# Patient Record
Sex: Female | Born: 1950
Health system: Southern US, Community
[De-identification: ages and names within clinical notes are randomized; demographics above are authoritative.]

## PROBLEM LIST (undated history)

## (undated) DIAGNOSIS — T4145XA Adverse effect of unspecified anesthetic, initial encounter: Secondary | ICD-10-CM

## (undated) DIAGNOSIS — K869 Disease of pancreas, unspecified: Secondary | ICD-10-CM

## (undated) DIAGNOSIS — H269 Unspecified cataract: Secondary | ICD-10-CM

## (undated) DIAGNOSIS — T8859XA Other complications of anesthesia, initial encounter: Secondary | ICD-10-CM

## (undated) DIAGNOSIS — E119 Type 2 diabetes mellitus without complications: Secondary | ICD-10-CM

## (undated) DIAGNOSIS — Z923 Personal history of irradiation: Secondary | ICD-10-CM

## (undated) DIAGNOSIS — C541 Malignant neoplasm of endometrium: Secondary | ICD-10-CM

## (undated) HISTORY — DX: Unspecified cataract: H26.9

## (undated) HISTORY — PX: COLONOSCOPY: SHX174

## (undated) HISTORY — PX: CARDIAC SURGERY: SHX584

## (undated) HISTORY — PX: APPENDECTOMY: SHX54

## (undated) HISTORY — DX: Personal history of irradiation: Z92.3

## (undated) HISTORY — PX: OTHER SURGICAL HISTORY: SHX169

## (undated) HISTORY — PX: BACK SURGERY: SHX140

## (undated) HISTORY — DX: Type 2 diabetes mellitus without complications: E11.9

---
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
## 2006-02-09 ENCOUNTER — Ambulatory Visit: Payer: Self-pay | Admitting: Gastroenterology

## 2006-02-16 ENCOUNTER — Ambulatory Visit: Payer: Self-pay | Admitting: Gastroenterology

## 2011-05-13 HISTORY — PX: SVT ABLATION: EP1225

## 2013-09-26 DIAGNOSIS — E785 Hyperlipidemia, unspecified: Secondary | ICD-10-CM | POA: Diagnosis not present

## 2013-09-26 DIAGNOSIS — M949 Disorder of cartilage, unspecified: Secondary | ICD-10-CM | POA: Diagnosis not present

## 2013-09-26 DIAGNOSIS — E119 Type 2 diabetes mellitus without complications: Secondary | ICD-10-CM | POA: Diagnosis not present

## 2013-09-26 DIAGNOSIS — M899 Disorder of bone, unspecified: Secondary | ICD-10-CM | POA: Diagnosis not present

## 2013-09-30 DIAGNOSIS — E785 Hyperlipidemia, unspecified: Secondary | ICD-10-CM | POA: Diagnosis not present

## 2013-09-30 DIAGNOSIS — Z0000 Encounter for general adult medical examination without abnormal findings: Secondary | ICD-10-CM | POA: Diagnosis not present

## 2013-09-30 DIAGNOSIS — K5909 Other constipation: Secondary | ICD-10-CM | POA: Diagnosis not present

## 2013-09-30 DIAGNOSIS — G4700 Insomnia, unspecified: Secondary | ICD-10-CM | POA: Diagnosis not present

## 2013-09-30 DIAGNOSIS — E119 Type 2 diabetes mellitus without complications: Secondary | ICD-10-CM | POA: Diagnosis not present

## 2013-09-30 DIAGNOSIS — I498 Other specified cardiac arrhythmias: Secondary | ICD-10-CM | POA: Diagnosis not present

## 2013-09-30 DIAGNOSIS — M899 Disorder of bone, unspecified: Secondary | ICD-10-CM | POA: Diagnosis not present

## 2013-09-30 DIAGNOSIS — J309 Allergic rhinitis, unspecified: Secondary | ICD-10-CM | POA: Diagnosis not present

## 2013-09-30 DIAGNOSIS — Z23 Encounter for immunization: Secondary | ICD-10-CM | POA: Diagnosis not present

## 2013-09-30 DIAGNOSIS — Z1331 Encounter for screening for depression: Secondary | ICD-10-CM | POA: Diagnosis not present

## 2013-09-30 DIAGNOSIS — M949 Disorder of cartilage, unspecified: Secondary | ICD-10-CM | POA: Diagnosis not present

## 2013-09-30 DIAGNOSIS — Z124 Encounter for screening for malignant neoplasm of cervix: Secondary | ICD-10-CM | POA: Diagnosis not present

## 2013-10-17 DIAGNOSIS — N6459 Other signs and symptoms in breast: Secondary | ICD-10-CM | POA: Diagnosis not present

## 2013-10-17 DIAGNOSIS — N63 Unspecified lump in unspecified breast: Secondary | ICD-10-CM | POA: Diagnosis not present

## 2013-10-17 DIAGNOSIS — Z803 Family history of malignant neoplasm of breast: Secondary | ICD-10-CM | POA: Diagnosis not present

## 2013-11-02 DIAGNOSIS — M899 Disorder of bone, unspecified: Secondary | ICD-10-CM | POA: Diagnosis not present

## 2013-11-02 DIAGNOSIS — M949 Disorder of cartilage, unspecified: Secondary | ICD-10-CM | POA: Diagnosis not present

## 2014-02-07 DIAGNOSIS — H2510 Age-related nuclear cataract, unspecified eye: Secondary | ICD-10-CM | POA: Diagnosis not present

## 2014-02-07 DIAGNOSIS — E119 Type 2 diabetes mellitus without complications: Secondary | ICD-10-CM | POA: Diagnosis not present

## 2014-02-07 DIAGNOSIS — H5210 Myopia, unspecified eye: Secondary | ICD-10-CM | POA: Diagnosis not present

## 2014-02-07 DIAGNOSIS — H40019 Open angle with borderline findings, low risk, unspecified eye: Secondary | ICD-10-CM | POA: Diagnosis not present

## 2014-02-10 DIAGNOSIS — Z23 Encounter for immunization: Secondary | ICD-10-CM | POA: Diagnosis not present

## 2014-04-19 DIAGNOSIS — M8580 Other specified disorders of bone density and structure, unspecified site: Secondary | ICD-10-CM | POA: Diagnosis not present

## 2014-04-19 DIAGNOSIS — E119 Type 2 diabetes mellitus without complications: Secondary | ICD-10-CM | POA: Diagnosis not present

## 2014-04-19 DIAGNOSIS — I498 Other specified cardiac arrhythmias: Secondary | ICD-10-CM | POA: Diagnosis not present

## 2014-04-19 DIAGNOSIS — E785 Hyperlipidemia, unspecified: Secondary | ICD-10-CM | POA: Diagnosis not present

## 2014-04-19 DIAGNOSIS — Z681 Body mass index (BMI) 19 or less, adult: Secondary | ICD-10-CM | POA: Diagnosis not present

## 2014-04-19 DIAGNOSIS — Z1389 Encounter for screening for other disorder: Secondary | ICD-10-CM | POA: Diagnosis not present

## 2014-04-19 DIAGNOSIS — G4700 Insomnia, unspecified: Secondary | ICD-10-CM | POA: Diagnosis not present

## 2014-05-30 DIAGNOSIS — H40013 Open angle with borderline findings, low risk, bilateral: Secondary | ICD-10-CM | POA: Diagnosis not present

## 2014-10-03 DIAGNOSIS — Z0000 Encounter for general adult medical examination without abnormal findings: Secondary | ICD-10-CM | POA: Diagnosis not present

## 2014-10-03 DIAGNOSIS — M859 Disorder of bone density and structure, unspecified: Secondary | ICD-10-CM | POA: Diagnosis not present

## 2014-10-03 DIAGNOSIS — E119 Type 2 diabetes mellitus without complications: Secondary | ICD-10-CM | POA: Diagnosis not present

## 2014-10-03 DIAGNOSIS — E785 Hyperlipidemia, unspecified: Secondary | ICD-10-CM | POA: Diagnosis not present

## 2014-10-05 DIAGNOSIS — E119 Type 2 diabetes mellitus without complications: Secondary | ICD-10-CM | POA: Diagnosis not present

## 2014-10-05 DIAGNOSIS — K5909 Other constipation: Secondary | ICD-10-CM | POA: Diagnosis not present

## 2014-10-05 DIAGNOSIS — Z1389 Encounter for screening for other disorder: Secondary | ICD-10-CM | POA: Diagnosis not present

## 2014-10-05 DIAGNOSIS — Z0000 Encounter for general adult medical examination without abnormal findings: Secondary | ICD-10-CM | POA: Diagnosis not present

## 2014-10-05 DIAGNOSIS — M8580 Other specified disorders of bone density and structure, unspecified site: Secondary | ICD-10-CM | POA: Diagnosis not present

## 2014-10-05 DIAGNOSIS — Z23 Encounter for immunization: Secondary | ICD-10-CM | POA: Diagnosis not present

## 2014-10-05 DIAGNOSIS — E785 Hyperlipidemia, unspecified: Secondary | ICD-10-CM | POA: Diagnosis not present

## 2014-10-05 DIAGNOSIS — Z6820 Body mass index (BMI) 20.0-20.9, adult: Secondary | ICD-10-CM | POA: Diagnosis not present

## 2014-10-05 DIAGNOSIS — G4700 Insomnia, unspecified: Secondary | ICD-10-CM | POA: Diagnosis not present

## 2014-10-05 DIAGNOSIS — I498 Other specified cardiac arrhythmias: Secondary | ICD-10-CM | POA: Diagnosis not present

## 2014-10-24 DIAGNOSIS — Z803 Family history of malignant neoplasm of breast: Secondary | ICD-10-CM | POA: Diagnosis not present

## 2014-10-24 DIAGNOSIS — Z1231 Encounter for screening mammogram for malignant neoplasm of breast: Secondary | ICD-10-CM | POA: Diagnosis not present

## 2014-12-12 ENCOUNTER — Encounter: Payer: Self-pay | Admitting: Gastroenterology

## 2015-02-16 DIAGNOSIS — E119 Type 2 diabetes mellitus without complications: Secondary | ICD-10-CM | POA: Diagnosis not present

## 2015-02-16 DIAGNOSIS — H40013 Open angle with borderline findings, low risk, bilateral: Secondary | ICD-10-CM | POA: Diagnosis not present

## 2015-02-16 DIAGNOSIS — H25011 Cortical age-related cataract, right eye: Secondary | ICD-10-CM | POA: Diagnosis not present

## 2015-02-16 DIAGNOSIS — H5211 Myopia, right eye: Secondary | ICD-10-CM | POA: Diagnosis not present

## 2015-03-06 DIAGNOSIS — Z23 Encounter for immunization: Secondary | ICD-10-CM | POA: Diagnosis not present

## 2015-04-11 DIAGNOSIS — E785 Hyperlipidemia, unspecified: Secondary | ICD-10-CM | POA: Diagnosis not present

## 2015-04-11 DIAGNOSIS — K5909 Other constipation: Secondary | ICD-10-CM | POA: Diagnosis not present

## 2015-04-11 DIAGNOSIS — E119 Type 2 diabetes mellitus without complications: Secondary | ICD-10-CM | POA: Diagnosis not present

## 2015-04-11 DIAGNOSIS — Z681 Body mass index (BMI) 19 or less, adult: Secondary | ICD-10-CM | POA: Diagnosis not present

## 2015-04-11 DIAGNOSIS — M859 Disorder of bone density and structure, unspecified: Secondary | ICD-10-CM | POA: Diagnosis not present

## 2015-04-18 ENCOUNTER — Ambulatory Visit (INDEPENDENT_AMBULATORY_CARE_PROVIDER_SITE_OTHER): Payer: Medicare Other | Admitting: Emergency Medicine

## 2015-04-18 VITALS — BP 120/68 | HR 99 | Temp 98.4°F | Resp 16 | Ht 62.0 in | Wt 112.0 lb

## 2015-04-18 DIAGNOSIS — M79641 Pain in right hand: Secondary | ICD-10-CM | POA: Diagnosis not present

## 2015-04-18 DIAGNOSIS — S61411A Laceration without foreign body of right hand, initial encounter: Secondary | ICD-10-CM | POA: Diagnosis not present

## 2015-04-18 MED ORDER — CEPHALEXIN 500 MG PO CAPS: 500.0000 mg | ORAL_CAPSULE | Freq: Four times a day (QID) | ORAL | Status: DC

## 2015-04-18 NOTE — Patient Instructions (Signed)

## 2015-04-18 NOTE — Progress Notes (Signed)
Subjective:  Patient ID: Kimberly [name], female    DOB: May 26, 1950  Age: 64 y.o. MRN: [id number]  CC: Hand Injury   HPI Kimberly [name] presents   With a laceration of the right hand  On the palm. She was injured last night on a mandolin which is a form of food slicer. She is current on tetanus. Denies any other injury. She is very concerned about the wound. She's also very germ phobic.  History Kimberly [name] has a past medical history of Diabetes mellitus without complication ([place]).   She has past surgical history that includes Appendectomy; Back surgery; Cardiac surgery; and OVARY REMOVE.   Her  family history includes Cancer in her sister; Diabetes in her mother; Heart disease in her father.  She   reports that she has never smoked. She has never used smokeless tobacco. She reports that she drinks alcohol. She reports that she does not use illicit drugs.  No outpatient prescriptions prior to visit.   No facility-administered medications prior to visit.    Social History   Social History  . Marital Status: Divorced    Spouse Name: N/A  . Number of Children: N/A  . Years of Education: N/A   Social History Main Topics  . Smoking status: Never Smoker   . Smokeless tobacco: Never Used  . Alcohol Use: 0.0 oz/week    0 Standard drinks or equivalent per week  . Drug Use: No  . Sexual Activity: Not Asked   Other Topics Concern  . None   Social History Narrative  . None     Review of Systems  Constitutional: Negative for fever, chills and appetite change.  HENT: Negative for congestion, ear pain, postnasal drip, sinus pressure and sore throat.   Eyes: Negative for pain and redness.  Respiratory: Negative for cough, shortness of breath and wheezing.   Cardiovascular: Negative for leg swelling.  Gastrointestinal: Negative for nausea, vomiting, abdominal pain, diarrhea, constipation and blood in stool.  Endocrine: Negative for polyuria.  Genitourinary: Negative for dysuria,  urgency, frequency and flank pain.  Musculoskeletal: Negative for gait problem.  Skin: Positive for wound. Negative for rash.  Neurological: Negative for weakness and headaches.  Psychiatric/Behavioral: Negative for confusion and decreased concentration. The patient is not nervous/anxious.     Objective:  BP 120/68 mmHg  Pulse 99  Temp(Src) 98.4 F (36.9 C) (Oral)  Resp 16  Ht 5\' 2"  (1.575 m)  Wt 112 lb (50.803 kg)  BMI 20.48 kg/m2  SpO2 97%  Physical Exam  Constitutional: She is oriented to person, place, and time. She appears well-developed and well-nourished. No distress.  HENT:  Head: Normocephalic and atraumatic.  Right Ear: External ear normal.  Left Ear: External ear normal.  Nose: Nose normal.  Eyes: Conjunctivae and EOM are normal. Pupils are equal, round, and reactive to light. No scleral icterus.  Neck: Normal range of motion. Neck supple. No tracheal deviation present.  Cardiovascular: Normal rate, regular rhythm and normal heart sounds.   Pulmonary/Chest: Effort normal. No respiratory distress. She has no wheezes. She has no rales.  Abdominal: She exhibits no mass. There is no tenderness. There is no rebound and no guarding.  Musculoskeletal: She exhibits no edema.  Lymphadenopathy:    She has no cervical adenopathy.  Neurological: She is alert and oriented to person, place, and time. Coordination normal.  Skin: Skin is warm and dry. Laceration (Should the full-thickness skin avulsion on the palm of her hand at the base of thumb proximally  2 x 3 cm. Is no neurovascular tendon injury no foreign body) noted. No rash noted.  Psychiatric: She has a normal mood and affect. Her behavior is normal.      Assessment & Plan:   Kimberly [name] was seen today for hand injury.  Diagnoses and all orders for this visit:  Pain of right hand  Laceration of right hand, initial encounter  Other orders -     cephALEXin (KEFLEX) 500 MG capsule; Take 1 capsule (500 mg total) by  mouth 4 (four) times daily.   I am having Ms. Kimberly [name] start on cephALEXin. I am also having her maintain her metFORMIN, rosuvastatin, raloxifene, and zolpidem.  Meds ordered this encounter  Medications  . metFORMIN (GLUCOPHAGE) 1000 MG tablet    Sig: Take 1,000 mg by mouth 2 (two) times daily with a meal.  . rosuvastatin (CRESTOR) 10 MG tablet    Sig: Take 10 mg by mouth daily.  . raloxifene (EVISTA) 60 MG tablet    Sig: Take 60 mg by mouth daily.  Marland Kitchen zolpidem (AMBIEN CR) 12.5 MG CR tablet    Sig: Take 12.5 mg by mouth at bedtime as needed for sleep.  . cephALEXin (KEFLEX) 500 MG capsule    Sig: Take 1 capsule (500 mg total) by mouth 4 (four) times daily.    Dispense:  30 capsule    Refill:  0   After suitable informed verbal consent was obtained the patient hand was prepped and draped in suitable fashion and infiltrated locally with lidocaine after suitable anesthesia was accomplished the wound was excised debriding the edge of the skin. The subcutaneous fat which interfered with closure of the wound was debrided. The wound was then prepped again with Betadine and the wound closed in one layer with simple interrupted sutures of 4-0 nylon for the procedure well left room in good condition  Appropriate red flag conditions were discussed with the patient as well as actions that should be taken.  Patient expressed his understanding.  Follow-up: Return in about 1 week (around 04/25/2015).  Roselee Culver, MD

## 2015-04-28 ENCOUNTER — Ambulatory Visit (INDEPENDENT_AMBULATORY_CARE_PROVIDER_SITE_OTHER): Payer: Medicare Other | Admitting: Physician Assistant

## 2015-04-28 VITALS — BP 108/64 | HR 97 | Temp 98.4°F | Resp 16 | Ht 62.0 in

## 2015-04-28 DIAGNOSIS — S61411D Laceration without foreign body of right hand, subsequent encounter: Secondary | ICD-10-CM

## 2015-04-28 NOTE — Progress Notes (Signed)
[facility] [address] [place] [address] [phone]  Date:  04/28/2015   Name:  Kimberly [name]   DOB:  08-Feb-1951   MRN:  [id number]  PCP:  No PCP Per Patient    Chief Complaint: Suture / Staple Removal   History of Present Illness:  This is a 64 y.o. female who is presenting for suture removal from a laceration on her right palm. Happened while using her mandolin slicer at home. Repaired by Dr. Ouida Sills on 04/18/15. #12 sutures placed. She reports no problems. No erythema, significant pain or drainage. She was placed on keflex d/t being a germaphobe.  Review of Systems:  Review of Systems See HPI  There are no active problems to display for this patient.   Prior to Admission medications   Medication Sig Start Date End Date Taking? Authorizing Provider  cephALEXin (KEFLEX) 500 MG capsule Take 1 capsule (500 mg total) by mouth 4 (four) times daily. 04/18/15  Yes Roselee Culver, MD  metFORMIN (GLUCOPHAGE) 1000 MG tablet Take 1,000 mg by mouth 2 (two) times daily with a meal.   Yes Historical Provider, MD  raloxifene (EVISTA) 60 MG tablet Take 60 mg by mouth daily.   Yes Historical Provider, MD  rosuvastatin (CRESTOR) 10 MG tablet Take 10 mg by mouth daily.   Yes Historical Provider, MD  zolpidem (AMBIEN CR) 12.5 MG CR tablet Take 12.5 mg by mouth at bedtime as needed for sleep.   Yes Historical Provider, MD    Allergies  Allergen Reactions  . Sulfur Hives    Past Surgical History  Procedure Laterality Date  . Appendectomy    . Back surgery    . Cardiac surgery    . Ovary remove      Social History  Substance Use Topics  . Smoking status: Never Smoker   . Smokeless tobacco: Never Used  . Alcohol Use: 0.0 oz/week    0 Standard drinks or equivalent per week    Family History  Problem Relation Age of Onset  . Diabetes Mother   . Heart disease Father   . Cancer Sister     Medication list has been reviewed and updated.  Physical  Examination:  Physical Exam  Constitutional: She is oriented to person, place, and time. She appears well-developed and well-nourished. No distress.  HENT:  Head: Normocephalic and atraumatic.  Right Ear: Hearing normal.  Left Ear: Hearing normal.  Nose: Nose normal.  Eyes: Conjunctivae and lids are normal. Right eye exhibits no discharge. Left eye exhibits no discharge. No scleral icterus.  Pulmonary/Chest: Effort normal. No respiratory distress.  Musculoskeletal: Normal range of motion.  Neurological: She is alert and oriented to person, place, and time.  Skin: Skin is warm, dry and intact.  Right palm with laceration that is healing well and no signs of infection. #12 sutures removed.  Psychiatric: She has a normal mood and affect. Her speech is normal and behavior is normal. Thought content normal.   BP 108/64 mmHg  Pulse 97  Temp(Src) 98.4 F (36.9 C) (Oral)  Resp 16  Ht 5\' 2"  (1.575 m)  SpO2 98%  Assessment and Plan:  1. Laceration of right hand, subsequent encounter Laceration healed well. Sutures removed. Return as needed.   Benjaman Pott Drenda Freeze, MHS [facility]  04/28/2015

## 2015-08-20 DIAGNOSIS — H2513 Age-related nuclear cataract, bilateral: Secondary | ICD-10-CM | POA: Diagnosis not present

## 2015-08-20 DIAGNOSIS — H40011 Open angle with borderline findings, low risk, right eye: Secondary | ICD-10-CM | POA: Diagnosis not present

## 2015-08-20 DIAGNOSIS — H40012 Open angle with borderline findings, low risk, left eye: Secondary | ICD-10-CM | POA: Diagnosis not present

## 2015-10-29 DIAGNOSIS — E119 Type 2 diabetes mellitus without complications: Secondary | ICD-10-CM | POA: Diagnosis not present

## 2015-10-29 DIAGNOSIS — E784 Other hyperlipidemia: Secondary | ICD-10-CM | POA: Diagnosis not present

## 2015-10-29 DIAGNOSIS — M859 Disorder of bone density and structure, unspecified: Secondary | ICD-10-CM | POA: Diagnosis not present

## 2015-11-05 DIAGNOSIS — G4709 Other insomnia: Secondary | ICD-10-CM | POA: Diagnosis not present

## 2015-11-05 DIAGNOSIS — M859 Disorder of bone density and structure, unspecified: Secondary | ICD-10-CM | POA: Diagnosis not present

## 2015-11-05 DIAGNOSIS — E784 Other hyperlipidemia: Secondary | ICD-10-CM | POA: Diagnosis not present

## 2015-11-05 DIAGNOSIS — E119 Type 2 diabetes mellitus without complications: Secondary | ICD-10-CM | POA: Diagnosis not present

## 2015-11-05 DIAGNOSIS — I498 Other specified cardiac arrhythmias: Secondary | ICD-10-CM | POA: Diagnosis not present

## 2015-11-05 DIAGNOSIS — Z0000 Encounter for general adult medical examination without abnormal findings: Secondary | ICD-10-CM | POA: Diagnosis not present

## 2015-11-05 DIAGNOSIS — J3089 Other allergic rhinitis: Secondary | ICD-10-CM | POA: Diagnosis not present

## 2015-11-05 DIAGNOSIS — Z6820 Body mass index (BMI) 20.0-20.9, adult: Secondary | ICD-10-CM | POA: Diagnosis not present

## 2015-11-05 DIAGNOSIS — Z1231 Encounter for screening mammogram for malignant neoplasm of breast: Secondary | ICD-10-CM | POA: Diagnosis not present

## 2015-11-05 DIAGNOSIS — Z124 Encounter for screening for malignant neoplasm of cervix: Secondary | ICD-10-CM | POA: Diagnosis not present

## 2015-11-05 DIAGNOSIS — Z1389 Encounter for screening for other disorder: Secondary | ICD-10-CM | POA: Diagnosis not present

## 2015-11-05 DIAGNOSIS — Z803 Family history of malignant neoplasm of breast: Secondary | ICD-10-CM | POA: Diagnosis not present

## 2015-11-05 DIAGNOSIS — M25562 Pain in left knee: Secondary | ICD-10-CM | POA: Diagnosis not present

## 2015-11-07 DIAGNOSIS — Z1231 Encounter for screening mammogram for malignant neoplasm of breast: Secondary | ICD-10-CM | POA: Diagnosis not present

## 2015-12-24 DIAGNOSIS — N390 Urinary tract infection, site not specified: Secondary | ICD-10-CM | POA: Diagnosis not present

## 2015-12-24 DIAGNOSIS — R8290 Unspecified abnormal findings in urine: Secondary | ICD-10-CM | POA: Diagnosis not present

## 2016-01-02 ENCOUNTER — Encounter: Payer: Self-pay | Admitting: Gastroenterology

## 2016-01-26 DIAGNOSIS — Z23 Encounter for immunization: Secondary | ICD-10-CM | POA: Diagnosis not present

## 2016-03-04 DIAGNOSIS — H52203 Unspecified astigmatism, bilateral: Secondary | ICD-10-CM | POA: Diagnosis not present

## 2016-03-04 DIAGNOSIS — H40011 Open angle with borderline findings, low risk, right eye: Secondary | ICD-10-CM | POA: Diagnosis not present

## 2016-03-04 DIAGNOSIS — E119 Type 2 diabetes mellitus without complications: Secondary | ICD-10-CM | POA: Diagnosis not present

## 2016-03-04 DIAGNOSIS — H40012 Open angle with borderline findings, low risk, left eye: Secondary | ICD-10-CM | POA: Diagnosis not present

## 2016-04-23 DIAGNOSIS — E119 Type 2 diabetes mellitus without complications: Secondary | ICD-10-CM | POA: Diagnosis not present

## 2016-04-23 DIAGNOSIS — M859 Disorder of bone density and structure, unspecified: Secondary | ICD-10-CM | POA: Diagnosis not present

## 2016-04-23 DIAGNOSIS — E784 Other hyperlipidemia: Secondary | ICD-10-CM | POA: Diagnosis not present

## 2016-04-23 DIAGNOSIS — Z6820 Body mass index (BMI) 20.0-20.9, adult: Secondary | ICD-10-CM | POA: Diagnosis not present

## 2016-06-13 DIAGNOSIS — E119 Type 2 diabetes mellitus without complications: Secondary | ICD-10-CM | POA: Diagnosis not present

## 2016-06-13 DIAGNOSIS — Z6820 Body mass index (BMI) 20.0-20.9, adult: Secondary | ICD-10-CM | POA: Diagnosis not present

## 2016-06-13 DIAGNOSIS — M5416 Radiculopathy, lumbar region: Secondary | ICD-10-CM | POA: Diagnosis not present

## 2016-07-10 HISTORY — PX: EYE SURGERY: SHX253

## 2016-07-30 ENCOUNTER — Encounter: Payer: Self-pay | Admitting: Gastroenterology

## 2016-09-03 DIAGNOSIS — H40013 Open angle with borderline findings, low risk, bilateral: Secondary | ICD-10-CM | POA: Diagnosis not present

## 2016-09-24 ENCOUNTER — Ambulatory Visit (AMBULATORY_SURGERY_CENTER): Payer: Self-pay | Admitting: *Deleted

## 2016-09-24 VITALS — Ht 62.0 in | Wt 111.0 lb

## 2016-09-24 DIAGNOSIS — Z1211 Encounter for screening for malignant neoplasm of colon: Secondary | ICD-10-CM

## 2016-09-24 MED ORDER — NA SULFATE-K SULFATE-MG SULF 17.5-3.13-1.6 GM/177ML PO SOLN: ORAL | 0 refills | Status: DC

## 2016-09-24 NOTE — Progress Notes (Signed)
Patient denies any allergies to eggs or soy. Patient has nausea with anesthesia. Patient denies any oxygen use at home and does not take any diet/weight loss medications. EMMI education assisgned to patient on colonoscopy, this was explained and instructions given to patient.

## 2016-10-08 ENCOUNTER — Encounter: Payer: Self-pay | Admitting: Gastroenterology

## 2016-10-08 ENCOUNTER — Ambulatory Visit (AMBULATORY_SURGERY_CENTER): Payer: Medicare Other | Admitting: Gastroenterology

## 2016-10-08 VITALS — BP 124/57 | HR 55 | Temp 95.1°F | Resp 11 | Ht 62.0 in | Wt 111.0 lb

## 2016-10-08 DIAGNOSIS — E119 Type 2 diabetes mellitus without complications: Secondary | ICD-10-CM | POA: Diagnosis not present

## 2016-10-08 DIAGNOSIS — Z1211 Encounter for screening for malignant neoplasm of colon: Secondary | ICD-10-CM

## 2016-10-08 DIAGNOSIS — Z1212 Encounter for screening for malignant neoplasm of rectum: Secondary | ICD-10-CM | POA: Diagnosis not present

## 2016-10-08 DIAGNOSIS — K5730 Diverticulosis of large intestine without perforation or abscess without bleeding: Secondary | ICD-10-CM

## 2016-10-08 MED ORDER — SODIUM CHLORIDE 0.9 % IV SOLN: 500.0000 mL | INTRAVENOUS | Status: DC

## 2016-10-08 NOTE — Progress Notes (Signed)
To recovery, report to [place], RN, VSS 

## 2016-10-08 NOTE — Progress Notes (Signed)
Pt's states no medical or surgical changes since previsit or office visit. 

## 2016-10-08 NOTE — Patient Instructions (Signed)
YOU HAD AN ENDOSCOPIC PROCEDURE TODAY AT [facility]:   Refer to the procedure report that was given to you for any specific questions about what was found during the examination.  If the procedure report does not answer your questions, please call your gastroenterologist to clarify.  If you requested that your care partner not be given the details of your procedure findings, then the procedure report has been included in a sealed envelope for you to review at your convenience later.  YOU SHOULD EXPECT: Some feelings of bloating in the abdomen. Passage of more gas than usual.  Walking can help get rid of the air that was put into your GI tract during the procedure and reduce the bloating. If you had a lower endoscopy (such as a colonoscopy or flexible sigmoidoscopy) you may notice spotting of blood in your stool or on the toilet paper. If you underwent a bowel prep for your procedure, you may not have a normal bowel movement for a few days.  Please Note:  You might notice some irritation and congestion in your nose or some drainage.  This is from the oxygen used during your procedure.  There is no need for concern and it should clear up in a day or so.  SYMPTOMS TO REPORT IMMEDIATELY:   Following lower endoscopy (colonoscopy or flexible sigmoidoscopy):  Excessive amounts of blood in the stool  Significant tenderness or worsening of abdominal pains  Swelling of the abdomen that is new, acute  Fever of 100F or higher    For urgent or emergent issues, a gastroenterologist can be reached at any hour by calling [phone].   DIET:  We do recommend a small meal at first, but then you may proceed to your regular diet.  Drink plenty of fluids but you should avoid alcoholic beverages for 24 hours.  ACTIVITY:  You should plan to take it easy for the rest of today and you should NOT DRIVE or use heavy machinery until tomorrow (because of the sedation medicines used during the test).     FOLLOW UP: Our staff will call the number listed on your records the next business day following your procedure to check on you and address any questions or concerns that you may have regarding the information given to you following your procedure. If we do not reach you, we will leave a message.  However, if you are feeling well and you are not experiencing any problems, there is no need to return our call.  We will assume that you have returned to your regular daily activities without incident.  If any biopsies were taken you will be contacted by phone or by letter within the next 1-3 weeks.  Please call us at [phone] if you have not heard about the biopsies in 3 weeks.    SIGNATURES/CONFIDENTIALITY: You and/or your care partner have signed paperwork which will be entered into your electronic medical record.  These signatures attest to the fact that that the information above on your After Visit Summary has been reviewed and is understood.  Full responsibility of the confidentiality of this discharge information lies with you and/or your care-partner.   INFORMATION ON DIVERTICULOSIS AND HIGH FIBER DIET GIVEN TO YOU TODAY

## 2016-10-08 NOTE — Op Note (Signed)
[facility] Patient Name: Kimberly [name] Procedure Date: 10/08/2016 9:45 AM MRN: [id number] Endoscopist: Milus Banister , MD Age: 66 Referring MD:  Date of Birth: 1950/12/18 Gender: Female Account #: [id number] Procedure:                Colonoscopy Indications:              Screening for colorectal malignant neoplasm Medicines:                Monitored Anesthesia Care Procedure:                Pre-Anesthesia Assessment:                           - Prior to the procedure, a History and Physical                            was performed, and patient medications and                            allergies were reviewed. The patient's tolerance of                            previous anesthesia was also reviewed. The risks                            and benefits of the procedure and the sedation                            options and risks were discussed with the patient.                            All questions were answered, and informed consent                            was obtained. Prior Anticoagulants: The patient has                            taken no previous anticoagulant or antiplatelet                            agents. ASA Grade Assessment: II - A patient with                            mild systemic disease. After reviewing the risks                            and benefits, the patient was deemed in                            satisfactory condition to undergo the procedure.                           After obtaining informed consent, the colonoscope  was passed under direct vision. Throughout the                            procedure, the patient's blood pressure, pulse, and                            oxygen saturations were monitored continuously. The                            Colonoscope was introduced through the anus and                            advanced to the the cecum, identified by                            appendiceal orifice and  ileocecal valve. The                            colonoscopy was performed without difficulty. The                            patient tolerated the procedure well. The quality                            of the bowel preparation was excellent. The                            ileocecal valve, appendiceal orifice, and rectum                            were photographed. Scope In: 9:51:15 AM Scope Out: 10:07:04 AM Scope Withdrawal Time: 0 hours 6 minutes 22 seconds  Total Procedure Duration: 0 hours 15 minutes 49 seconds  Findings:                 A few small and large-mouthed diverticula were                            found in the left colon.                           The exam was otherwise without abnormality on                            direct and retroflexion views. Complications:            No immediate complications. Estimated blood loss:                            None. Estimated Blood Loss:     Estimated blood loss: none. Impression:               - Diverticulosis in the left colon.                           - The examination was otherwise normal on direct  and retroflexion views.                           - No polyps or cancers. Recommendation:           - Patient has a contact number available for                            emergencies. The signs and symptoms of potential                            delayed complications were discussed with the                            patient. Return to normal activities tomorrow.                            Written discharge instructions were provided to the                            patient.                           - Resume previous diet.                           - Continue present medications.                           - Repeat colonoscopy in 10 years for screening                            purposes. Milus Banister, MD 10/08/2016 10:08:50 AM This report has been signed electronically.

## 2016-10-09 ENCOUNTER — Telehealth: Payer: Self-pay | Admitting: *Deleted

## 2016-10-09 NOTE — Telephone Encounter (Signed)
  Follow up Call-  Call back number 10/08/2016  Post procedure Call Back phone  # [phone]  Permission to leave phone message Yes  Some recent data might be hidden     Patient questions:  Do you have a fever, pain , or abdominal swelling? No. Pain Score  0 *  Have you tolerated food without any problems? Yes.    Have you been able to return to your normal activities? Yes.    Do you have any questions about your discharge instructions: Diet   No. Medications  No. Follow up visit  No.  Do you have questions or concerns about your Care? No.  Actions: * If pain score is 4 or above: No action needed, pain <4.

## 2016-11-13 ENCOUNTER — Other Ambulatory Visit: Payer: Self-pay | Admitting: Internal Medicine

## 2016-11-13 DIAGNOSIS — Z1151 Encounter for screening for human papillomavirus (HPV): Secondary | ICD-10-CM | POA: Diagnosis not present

## 2016-11-13 DIAGNOSIS — N939 Abnormal uterine and vaginal bleeding, unspecified: Secondary | ICD-10-CM | POA: Diagnosis not present

## 2016-11-13 DIAGNOSIS — R102 Pelvic and perineal pain: Secondary | ICD-10-CM | POA: Diagnosis not present

## 2016-11-13 DIAGNOSIS — Z681 Body mass index (BMI) 19 or less, adult: Secondary | ICD-10-CM | POA: Diagnosis not present

## 2016-11-24 ENCOUNTER — Other Ambulatory Visit: Payer: 59

## 2016-12-31 DIAGNOSIS — Z419 Encounter for procedure for purposes other than remedying health state, unspecified: Secondary | ICD-10-CM | POA: Diagnosis not present

## 2016-12-31 DIAGNOSIS — L570 Actinic keratosis: Secondary | ICD-10-CM | POA: Diagnosis not present

## 2017-01-21 DIAGNOSIS — E119 Type 2 diabetes mellitus without complications: Secondary | ICD-10-CM | POA: Diagnosis not present

## 2017-01-21 DIAGNOSIS — E784 Other hyperlipidemia: Secondary | ICD-10-CM | POA: Diagnosis not present

## 2017-01-21 DIAGNOSIS — M859 Disorder of bone density and structure, unspecified: Secondary | ICD-10-CM | POA: Diagnosis not present

## 2017-01-30 DIAGNOSIS — Z1212 Encounter for screening for malignant neoplasm of rectum: Secondary | ICD-10-CM | POA: Diagnosis not present

## 2017-02-10 DIAGNOSIS — L570 Actinic keratosis: Secondary | ICD-10-CM | POA: Diagnosis not present

## 2017-02-27 DIAGNOSIS — N950 Postmenopausal bleeding: Secondary | ICD-10-CM | POA: Diagnosis not present

## 2017-02-27 DIAGNOSIS — Z1231 Encounter for screening mammogram for malignant neoplasm of breast: Secondary | ICD-10-CM | POA: Diagnosis not present

## 2017-02-27 DIAGNOSIS — Z8041 Family history of malignant neoplasm of ovary: Secondary | ICD-10-CM | POA: Diagnosis not present

## 2017-02-27 DIAGNOSIS — Z681 Body mass index (BMI) 19 or less, adult: Secondary | ICD-10-CM | POA: Diagnosis not present

## 2017-03-09 DIAGNOSIS — H5211 Myopia, right eye: Secondary | ICD-10-CM | POA: Diagnosis not present

## 2017-03-09 DIAGNOSIS — H2513 Age-related nuclear cataract, bilateral: Secondary | ICD-10-CM | POA: Diagnosis not present

## 2017-03-09 DIAGNOSIS — H40013 Open angle with borderline findings, low risk, bilateral: Secondary | ICD-10-CM | POA: Diagnosis not present

## 2017-03-09 DIAGNOSIS — E119 Type 2 diabetes mellitus without complications: Secondary | ICD-10-CM | POA: Diagnosis not present

## 2017-03-10 DIAGNOSIS — C541 Malignant neoplasm of endometrium: Secondary | ICD-10-CM | POA: Diagnosis not present

## 2017-03-10 DIAGNOSIS — N950 Postmenopausal bleeding: Secondary | ICD-10-CM | POA: Diagnosis not present

## 2017-03-31 ENCOUNTER — Telehealth: Payer: Self-pay | Admitting: *Deleted

## 2017-03-31 NOTE — Telephone Encounter (Signed)
Calling to confirm appointment and address.  My PCP gave me an address on [name]."  10:45 Appointment 04-01-2017 with Dr. Denman George.  Provided [facility]. address information for GPS use.  Denies further questions or needs at this time.

## 2017-04-01 ENCOUNTER — Encounter: Payer: Self-pay | Admitting: Gynecologic Oncology

## 2017-04-01 ENCOUNTER — Ambulatory Visit: Payer: Medicare Other | Attending: Gynecologic Oncology | Admitting: Gynecologic Oncology

## 2017-04-01 VITALS — BP 125/60 | HR 92 | Temp 98.2°F | Resp 20 | Ht 62.0 in | Wt 103.0 lb

## 2017-04-01 DIAGNOSIS — Z8041 Family history of malignant neoplasm of ovary: Secondary | ICD-10-CM | POA: Diagnosis not present

## 2017-04-01 DIAGNOSIS — Z8249 Family history of ischemic heart disease and other diseases of the circulatory system: Secondary | ICD-10-CM | POA: Insufficient documentation

## 2017-04-01 DIAGNOSIS — Z803 Family history of malignant neoplasm of breast: Secondary | ICD-10-CM | POA: Insufficient documentation

## 2017-04-01 DIAGNOSIS — Z882 Allergy status to sulfonamides status: Secondary | ICD-10-CM | POA: Insufficient documentation

## 2017-04-01 DIAGNOSIS — Z7984 Long term (current) use of oral hypoglycemic drugs: Secondary | ICD-10-CM | POA: Insufficient documentation

## 2017-04-01 DIAGNOSIS — E119 Type 2 diabetes mellitus without complications: Secondary | ICD-10-CM | POA: Insufficient documentation

## 2017-04-01 DIAGNOSIS — Z79899 Other long term (current) drug therapy: Secondary | ICD-10-CM | POA: Diagnosis not present

## 2017-04-01 DIAGNOSIS — C541 Malignant neoplasm of endometrium: Secondary | ICD-10-CM | POA: Insufficient documentation

## 2017-04-01 DIAGNOSIS — Z9889 Other specified postprocedural states: Secondary | ICD-10-CM | POA: Insufficient documentation

## 2017-04-01 DIAGNOSIS — Z7982 Long term (current) use of aspirin: Secondary | ICD-10-CM | POA: Diagnosis not present

## 2017-04-01 NOTE — Patient Instructions (Signed)
Plan for surgery at [facility] on April 13, 2017 with Dr. Cindie Laroche.  Your pre-op appointment will be at [facility] on April 07, 2017 at 2:15pm.  Please call for any questions or concerns.

## 2017-04-01 NOTE — Progress Notes (Signed)
Consult Note: Gyn-Onc  Consult was requested by Dr. Benjie Karvonen for the evaluation of Michel Bickers 66 y.o. female  CC:  Chief Complaint  Patient presents with  . Endometrial cancer determined by uterine biopsy [facility])    Assessment/Plan:  Ms. Jewelz Ricklefs  is a 66 y.o.  year old with grade 2 endometrioid endometrial cancer. She is thin and healthy, and known to be negative in genetic testing for both BRCA and Lynch syndrome (MLH/MSH) genes.  A detailed discussion was held with the patient and her family with regard to to her endometrial cancer diagnosis. We discussed the standard management options for uterine cancer which includes surgery followed possibly by adjuvant therapy depending on the results of surgery. The options for surgical management include a hysterectomy and removal of the tubes and ovaries possibly with removal of pelvic and para-aortic lymph nodes.If feasible, a minimally invasive approach including a robotic hysterectomy or laparoscopic hysterectomy have benefits including shorter hospital stay, recovery time and better wound healing than with open surgery. The patient has been counseled about these surgical options and the risks of surgery in general including infection, bleeding, damage to surrounding structures (including bowel, bladder, ureters, nerves or vessels), and the postoperative risks of PE/ DVT, and lymphedema. I extensively reviewed the additional risks of robotic hysterectomy including possible need for conversion to open laparotomy.  I discussed positioning during surgery of trendelenberg and risks of minor facial swelling and care we take in preoperative positioning.  After counseling and consideration of her options, she desires to proceed with robotic assisted total hysterectomy with bilateral sapingo-oophorectomy and SLN biopsy.   She will be seen by anesthesia for preoperative clearance and discussion of postoperative pain management.  She was given the opportunity to  ask questions, which were answered to her satisfaction, and she is agreement with the above mentioned plan of care.  In order to expedite surgery, we have offered her surgery with my partner, Dr Cindie Laroche at [facility] as we do not have availability until mid January.  HPI: Abelina Ketron is a 66 year old P2 who is seen in consultation at the request of Dr Benjie Karvonen for grade 2 endometrial cancer.  The patient has a history of postmenopausal spotting since April, 2018. She was seen by a PA in her PCP's office in July, 2018 and a pap test was performed which per patient was negative and therefore no intervention followed. The patient continued to have spotting and informed her PCP, Dr Brigitte Pulse, of this in October during her annual physical. He then promptly referred her to Dr [facility] office 02/27/17 and a TVUS was performed which showed a uterus measuring normal dimensions and a 8m endometrial stripe.  On 02/28/17 an office pipelle biopsy was performed and revealed a FIGO grade 2 endometrioid endometrial cancer.  The patient has a history of a prior USO (? Right) many years ago for a benign cyst on the ovary. She has also had an open appendectomy remotely. She is thin (103lbs) and otherwise very healthy.  She has had L5 surgery through posterior approach.  Her sister has a history of breast and ovarian cancer, and while she was not tested, Ms Brasington underwent genetic testing for BRCA and MLH/MSH abnormalities which were unremarkable.    Current Meds:  Outpatient Encounter Medications as of 04/01/2017  Medication Sig  . metFORMIN (GLUCOPHAGE) 1000 MG tablet Take 1,000 mg by mouth 2 (two) times daily with a meal.  . rosuvastatin (CRESTOR) 10 MG tablet Take  10 mg by mouth daily.  Marland Kitchen zolpidem (AMBIEN CR) 12.5 MG CR tablet Take 12.5 mg by mouth at bedtime as needed for sleep.  . [DISCONTINUED] Ascorbic Acid (VITAMIN C PO) Take 1 tablet by mouth daily.  . [DISCONTINUED] aspirin EC 81 MG tablet Take 81 mg  by mouth daily.  . [DISCONTINUED] Calcium Carbonate (CALTRATE 600 PO) Take 2,000 mg by mouth daily.  . [DISCONTINUED] Cyanocobalamin (VITAMIN B-12 PO) Take 1 tablet by mouth daily.  . [DISCONTINUED] IRON PO Take 1 tablet by mouth daily.  . [DISCONTINUED] Multiple Vitamin (MULTIVITAMIN) tablet Take 1 tablet by mouth daily.  . [DISCONTINUED] raloxifene (EVISTA) 60 MG tablet Take 60 mg by mouth daily.   Facility-Administered Encounter Medications as of 04/01/2017  Medication  . 0.9 %  sodium chloride infusion    Allergy:  Allergies  Allergen Reactions  . Sulfur Hives    Social Hx:   Social History   Socioeconomic History  . Marital status: Divorced    Spouse name: Not on file  . Number of children: Not on file  . Years of education: Not on file  . Highest education level: Not on file  Social Needs  . Financial resource strain: Not on file  . Food insecurity - worry: Not on file  . Food insecurity - inability: Not on file  . Transportation needs - medical: Not on file  . Transportation needs - non-medical: Not on file  Occupational History  . Not on file  Tobacco Use  . Smoking status: Never Smoker  . Smokeless tobacco: Never Used  Substance and Sexual Activity  . Alcohol use: Yes    Alcohol/week: 4.2 oz    Types: 7 Glasses of wine per week    Comment: 1 glass red wine each night with dinner  . Drug use: No  . Sexual activity: Not on file  Other Topics Concern  . Not on file  Social History Narrative  . Not on file    Past Surgical Hx:  Past Surgical History:  Procedure Laterality Date  . APPENDECTOMY    . BACK SURGERY    . CARDIAC SURGERY    . COLONOSCOPY    . OVARY REMOVE    . SVT ABLATION  2013    Past Medical Hx:  Past Medical History:  Diagnosis Date  . Cataract   . Diabetes mellitus without complication ([place])     Past Gynecological History:  SVD x 2 No LMP recorded. Patient is postmenopausal.  Family Hx:  Family History  Problem Relation Age  of Onset  . Diabetes Mother   . Heart disease Father   . Breast cancer Sister   . Colon cancer Neg Hx     Review of Systems:  Constitutional  Feels well,    ENT Normal appearing ears and nares bilaterally Skin/Breast  No rash, sores, jaundice, itching, dryness Cardiovascular  No chest pain, shortness of breath, or edema  Pulmonary  No cough or wheeze.  Gastro Intestinal  No nausea, vomitting, or diarrhoea. No bright red blood per rectum, no abdominal pain, change in bowel movement, or constipation.  Genito Urinary  No frequency, urgency, dysuria, + postmenopausal bleeding Musculo Skeletal  No myalgia, arthralgia, joint swelling or pain  Neurologic  No weakness, numbness, change in gait,  Psychology  No depression, anxiety, insomnia.   Vitals:  Blood pressure 125/60, pulse 92, temperature 98.2 F (36.8 C), temperature source Oral, resp. rate 20, height '5\' 2"'  (1.575 m), weight 103 lb (46.7  kg), SpO2 99 %.  Physical Exam: WD in NAD Neck  Supple NROM, without any enlargements.  Lymph Node Survey No cervical supraclavicular or inguinal adenopathy Cardiovascular  Pulse normal rate, regularity and rhythm. S1 and S2 normal.  Lungs  Clear to auscultation bilateraly, without wheezes/crackles/rhonchi. Good air movement.  Skin  No rash/lesions/breakdown  Psychiatry  Alert and oriented to person, place, and time  Abdomen  Normoactive bowel sounds, abdomen soft, non-tender and thin without evidence of hernia.  Back No CVA tenderness Genito Urinary  Vulva/vagina: Normal external female genitalia.   No lesions. No discharge or bleeding.  Bladder/urethra:  No lesions or masses, well supported bladder  Vagina: normal, atrophic, no lesions  Cervix: Normal appearing, no lesions.  Uterus:  Small, mobile, no parametrial involvement or nodularity.  Adnexa: no palpable masses. Rectal  deferred Extremities  No bilateral cyanosis, clubbing or edema.   Donaciano Eva, MD   04/01/2017, 11:33 AM

## 2017-04-07 DIAGNOSIS — Z803 Family history of malignant neoplasm of breast: Secondary | ICD-10-CM | POA: Diagnosis not present

## 2017-04-07 DIAGNOSIS — Z7984 Long term (current) use of oral hypoglycemic drugs: Secondary | ICD-10-CM | POA: Diagnosis not present

## 2017-04-07 DIAGNOSIS — C541 Malignant neoplasm of endometrium: Secondary | ICD-10-CM | POA: Diagnosis not present

## 2017-04-07 DIAGNOSIS — Z01818 Encounter for other preprocedural examination: Secondary | ICD-10-CM | POA: Diagnosis not present

## 2017-04-07 DIAGNOSIS — Z8249 Family history of ischemic heart disease and other diseases of the circulatory system: Secondary | ICD-10-CM | POA: Diagnosis not present

## 2017-04-07 DIAGNOSIS — Z9089 Acquired absence of other organs: Secondary | ICD-10-CM | POA: Diagnosis not present

## 2017-04-07 DIAGNOSIS — Z90722 Acquired absence of ovaries, bilateral: Secondary | ICD-10-CM | POA: Diagnosis not present

## 2017-04-07 DIAGNOSIS — E119 Type 2 diabetes mellitus without complications: Secondary | ICD-10-CM | POA: Diagnosis not present

## 2017-04-07 DIAGNOSIS — Z8041 Family history of malignant neoplasm of ovary: Secondary | ICD-10-CM | POA: Diagnosis not present

## 2017-04-07 DIAGNOSIS — Z681 Body mass index (BMI) 19 or less, adult: Secondary | ICD-10-CM | POA: Diagnosis not present

## 2017-04-07 DIAGNOSIS — Z882 Allergy status to sulfonamides status: Secondary | ICD-10-CM | POA: Diagnosis not present

## 2017-04-13 DIAGNOSIS — I471 Supraventricular tachycardia: Secondary | ICD-10-CM | POA: Diagnosis not present

## 2017-04-13 DIAGNOSIS — C541 Malignant neoplasm of endometrium: Secondary | ICD-10-CM | POA: Diagnosis not present

## 2017-04-13 DIAGNOSIS — Z882 Allergy status to sulfonamides status: Secondary | ICD-10-CM | POA: Diagnosis not present

## 2017-04-13 DIAGNOSIS — Z803 Family history of malignant neoplasm of breast: Secondary | ICD-10-CM | POA: Diagnosis not present

## 2017-04-13 DIAGNOSIS — E119 Type 2 diabetes mellitus without complications: Secondary | ICD-10-CM | POA: Diagnosis not present

## 2017-04-13 DIAGNOSIS — Z7984 Long term (current) use of oral hypoglycemic drugs: Secondary | ICD-10-CM | POA: Diagnosis not present

## 2017-04-13 HISTORY — PX: ROBOTIC ASSISTED TOTAL HYSTERECTOMY WITH SALPINGECTOMY: SHX6679

## 2017-04-14 DIAGNOSIS — Z882 Allergy status to sulfonamides status: Secondary | ICD-10-CM | POA: Diagnosis not present

## 2017-04-14 DIAGNOSIS — Z803 Family history of malignant neoplasm of breast: Secondary | ICD-10-CM | POA: Diagnosis not present

## 2017-04-14 DIAGNOSIS — I471 Supraventricular tachycardia: Secondary | ICD-10-CM | POA: Diagnosis not present

## 2017-04-14 DIAGNOSIS — Z7984 Long term (current) use of oral hypoglycemic drugs: Secondary | ICD-10-CM | POA: Diagnosis not present

## 2017-04-14 DIAGNOSIS — E119 Type 2 diabetes mellitus without complications: Secondary | ICD-10-CM | POA: Diagnosis not present

## 2017-04-14 DIAGNOSIS — C541 Malignant neoplasm of endometrium: Secondary | ICD-10-CM | POA: Diagnosis not present

## 2017-04-23 DIAGNOSIS — Z681 Body mass index (BMI) 19 or less, adult: Secondary | ICD-10-CM | POA: Diagnosis not present

## 2017-04-23 DIAGNOSIS — E119 Type 2 diabetes mellitus without complications: Secondary | ICD-10-CM | POA: Diagnosis not present

## 2017-04-23 DIAGNOSIS — E7849 Other hyperlipidemia: Secondary | ICD-10-CM | POA: Diagnosis not present

## 2017-04-23 DIAGNOSIS — M859 Disorder of bone density and structure, unspecified: Secondary | ICD-10-CM | POA: Diagnosis not present

## 2017-04-23 DIAGNOSIS — C541 Malignant neoplasm of endometrium: Secondary | ICD-10-CM | POA: Diagnosis not present

## 2017-04-29 ENCOUNTER — Ambulatory Visit: Payer: Medicare Other | Admitting: Gynecologic Oncology

## 2017-05-12 DIAGNOSIS — C541 Malignant neoplasm of endometrium: Secondary | ICD-10-CM

## 2017-05-12 HISTORY — DX: Malignant neoplasm of endometrium: C54.1

## 2017-05-14 DIAGNOSIS — Z7189 Other specified counseling: Secondary | ICD-10-CM | POA: Insufficient documentation

## 2017-05-15 DIAGNOSIS — Z7189 Other specified counseling: Secondary | ICD-10-CM | POA: Diagnosis not present

## 2017-05-15 DIAGNOSIS — C541 Malignant neoplasm of endometrium: Secondary | ICD-10-CM | POA: Diagnosis not present

## 2017-05-22 ENCOUNTER — Encounter: Payer: Self-pay | Admitting: Radiation Oncology

## 2017-05-22 NOTE — Progress Notes (Signed)
GYN Location of Tumor / Histology: grade 2 endometrioid endometrial cancer  Kimberly [name] presented with symptoms of: history of postmenopausal spotting since April, 2018.  Biopsies revealed:   04/13/17 A: Lymph node, left periaortic, sentinel, removal - Two lymph nodes with no metastatic carcinoma identified by H&E or pancytokeratin AE1/AE3 immunohistochemical stain (0/2)  B: Lymph node, left external iliac, sentinel #1, removal - One lymph node with no metastatic carcinoma identified by H&E or pancytokeratin AE1/AE3 immunohistochemical stain (0/1)  C: Lymph nodes, left external iliac, sentinel #2, removal - One lymph node with isolated tumor cells within lymphovascular spaces identified by H&E and pancytokeratin AE1/AE3 immunohistochemical stain (ITCs in 1/1)  D: Lymph node, right external iliac, sentinel, removal - One lymph node with no metastatic carcinoma identified by H&E or pancytokeratin AE1/AE3 immunohistochemical stain (0/1)  E: Lymph node, right periaortic, sentinel, removal - Two lymph nodes with no metastatic carcinoma identified by H&E or pancytokeratin AE1/AE3 immunohistochemical stain (0/2)  F: Uterus with cervix and bilateral ovaries and fallopian tubes, hysterectomy and bilateral salpingo-oophorectomy - Endometrioid adenocarcinoma, FIGO grade 3 (architecture grade 3, nuclear grade 2-3) with focal squamous differentiation  - Deep outer half myometrial invasion (>99%) present (stage pT1b) - Multifocal lymphovascular space invasion is present, including in right adnexal fibrovascular tissue - See synoptic report and comment  Past/Anticipated interventions by Gyn/Onc surgery, if any: 04/13/17 - total robotic hysterectomy, left salpingo-oophorectomy, sentinel lymph node identification and biopsy by Dr. Clarene Essex at [facility].  Past/Anticipated interventions by medical oncology, if any: no  Weight changes, if any: no  Bowel/Bladder complaints, if any: no  Nausea/Vomiting, if any:  no  Pain issues, if any:  no  SAFETY ISSUES:  Prior radiation? no  Pacemaker/ICD? no  Possible current pregnancy? no  Is the patient on methotrexate? no  Current Complaints / other details:  Dr. Clarene Essex is recommending "treatment with whole pelvic external beam radiation therapy, which would reduce the risk of pelvic/vaginal recurrence from 15+% range to about 5%."  Patient is here with her daughter.  BP 116/70 (BP Location: Left Arm, Patient Position: Sitting)   Pulse 77   Temp 98.3 F (36.8 C) (Oral)   Ht 5\' 2"  (1.575 m)   Wt 103 lb 6.4 oz (46.9 kg)   SpO2 100%   BMI 18.91 kg/m    Wt Readings from Last 3 Encounters:  05/27/17 103 lb 6.4 oz (46.9 kg)  04/01/17 103 lb (46.7 kg)  10/08/16 111 lb (50.3 kg)

## 2017-05-26 NOTE — Progress Notes (Signed)
Radiation Oncology         (336) [phone] ________________________________  Initial outpatient Consultation  Name: Kimberly [name] MRN: [id number]  Date: 05/27/2017  DOB: 08/27/1950  VE:LFYB, Gwyndolyn Saxon, MD  Cindie Laroche, MD   REFERRING PHYSICIAN: Cindie Laroche, MD  DIAGNOSIS:  Stage IB, grade 3 endometrioid adenocarcinoma of the endometrium, multifocal lymphovascular space invasion, isolated tumor cells within left external iliac lymph node chain.  HISTORY OF PRESENT ILLNESS::Kimberly [name] is a 67 y.o. female who is seen out courtesy of Dr. Cindie Laroche for consideration for postoperative radiation therapy as part of management of patient's recently diagnosed endometrioid endometrial cancer.  Patient has a history of postmenopausal spotting since April 2018. She was seen in Dr. Gardiner Coins practice. A Pap smear was performed in July which was negative. The patient continued to have some spotting and the patient was seen again in Dr. Gardiner Coins office. A transvaginal ultrasound was performed which showed a uterus measuring normal dimensions and a 6 mm endometrial stripe stripe. The patient proceeded to Pipelle biopsy which revealed FIGO grade 2 endometrioid endometrial cancer. Patient was then seen by Dr. Denman George and to facilitate surgery the patient was seen by Dr. Glade Nurse hospitals. On 04/13/2017 the patient was taken to operating room by Dr. Clarene Essex at which time the patient underwent total robotic hysterectomy, left salpingo-oophorectomy, sentinel lymph node identification and biopsy. Upon pathologic review the patient was found to have an endometrioid adenocarcinoma, FIGO grade 3 with focal squamous differentiation. The tumor extended to the outer half of the myometrium (greater than 99% invasion) and the tumor showed multifocal lymphovascular space invasion and positive isolated tumor cells within the left external iliac sentinel lymph node chain. Final pathologic stage was pT1b pN0(i+) ( FIGO stage Ib). The patient  was presented at the multidisciplinary gynecologic oncology conference at [facility] and given the above pathologic findings it was recommended that the patient proceed with postoperative pelvic radiation therapy with strong consideration for vaginal brachytherapy. The conference felt that the risk of pelvic/vaginal recurrence was greater than 15% with postoperative adjuvant treatment reducing this risk to less than 5%.  PREVIOUS RADIATION THERAPY: No  PAST MEDICAL HISTORY:  has a past medical history of Cataract, Diabetes mellitus without complication ([place]), and Endometrial cancer ([place]).    PAST SURGICAL HISTORY: Past Surgical History:  Procedure Laterality Date  . APPENDECTOMY    . BACK SURGERY    . CARDIAC SURGERY    . COLONOSCOPY    . OVARY REMOVE    . ROBOTIC ASSISTED TOTAL HYSTERECTOMY WITH SALPINGECTOMY  04/13/2017   total robotic hysterectomy, left salpingo-oophorectomy, sentinel lymph node identification and biopsy by Dr. Clarene Essex at [facility]  . SVT ABLATION  2013    FAMILY HISTORY: family history includes Breast cancer in her sister; Diabetes in her mother; Heart disease in her father.  SOCIAL HISTORY:  reports that  has never smoked. she has never used smokeless tobacco. She reports that she drinks about 4.2 oz of alcohol per week. She reports that she does not use drugs.  ALLERGIES: Sulfur  MEDICATIONS:  Current Outpatient Medications  Medication Sig Dispense Refill  . metFORMIN (GLUCOPHAGE) 1000 MG tablet Take 1,000 mg by mouth 2 (two) times daily with a meal.    . rosuvastatin (CRESTOR) 10 MG tablet Take 10 mg by mouth daily.    Marland Kitchen zolpidem (AMBIEN CR) 12.5 MG CR tablet Take 12.5 mg by mouth at bedtime as needed for sleep.     Current Facility-Administered Medications  Medication Dose Route  Frequency Provider Last Rate Last Dose  . 0.9 %  sodium chloride infusion  500 mL Intravenous Continuous Milus Banister, MD        REVIEW OF SYSTEMS:  REVIEW OF SYSTEMS: A 10+ POINT REVIEW  OF SYSTEMS WAS OBTAINED including neurology, dermatology, psychiatry, cardiac, respiratory, lymph, extremities, GI, GU, musculoskeletal, constitutional, reproductive, HEENT. All pertinent positives are noted in the HPI. All others are negative. She has no some mild numbness along the left anterior thigh since her surgery. No weakness or swelling in her left leg. She denies any appetite or bowel problems at this time. No vaginal bleeding or discharge.   PHYSICAL EXAM:  height is 5\' 2"  (1.575 m) and weight is 103 lb 6.4 oz (46.9 kg). Her oral temperature is 98.3 F (36.8 C). Her blood pressure is 116/70 and her pulse is 77. Her oxygen saturation is 100%.   General: Alert and oriented, in no acute distress HEENT: Head is normocephalic. Extraocular movements are intact. Oropharynx is clear. Neck: Neck is supple, no palpable cervical or supraclavicular lymphadenopathy. Heart: Regular in rate and rhythm with no murmurs, rubs, or gallops. Chest: Clear to auscultation bilaterally, with no rhonchi, wheezes, or rales. Abdomen: Soft, nontender, nondistended, with no rigidity or guarding. Laparoscopic scars healing well without signs of drainage or infection Extremities: No cyanosis or edema. Lymphatics: see Neck Exam, no inguinal adenopathy Skin: No concerning lesions. Musculoskeletal: symmetric strength and muscle tone throughout. Neurologic: Cranial nerves II through XII are grossly intact. No obvious focalities. Speech is fluent. Coordination is intact. Psychiatric: Judgment and insight are intact. Affect is appropriate. Pelvic exam deferred until simulation and planning day    ECOG = 1  0 - Asymptomatic (Fully active, able to carry on all predisease activities without restriction)  1 - Symptomatic but completely ambulatory (Restricted in physically strenuous activity but ambulatory and able to carry out work of a light or sedentary nature. For example, light housework, office work)  2 -  Symptomatic, <50% in bed during the day (Ambulatory and capable of all self care but unable to carry out any work activities. Up and about more than 50% of waking hours)  3 - Symptomatic, >50% in bed, but not bedbound (Capable of only limited self-care, confined to bed or chair 50% or more of waking hours)  4 - Bedbound (Completely disabled. Cannot carry on any self-care. Totally confined to bed or chair)  5 - Death   Eustace Pen MM, Creech RH, Tormey DC, et al. [phone]). "Toxicity and response criteria of the [facility]". [place] Oncol. 5 (6): 649-55  LABORATORY DATA:  No results found for: WBC, HGB, HCT, MCV, PLT, NEUTROABS No results found for: NA, K, CL, CO2, GLUCOSE, CREATININE, CALCIUM    RADIOGRAPHY: No results found.    IMPRESSION: Stage IB, grade 3 endometrioid adenocarcinoma of the endometrium, multifocal lymphovascular space invasion, isolated tumor cells within left external iliac lymph node chain. The patient would be at risk for recurrence given these pathologic findings and I would agree with [facility] recommendation for postoperative pelvic radiation therapy. Given the deeply invasive nature of the tumor and the presence of LVI, I would also recommend vaginal brachytherapy as part of her management. I discussed the overall course of treatment side effects and potential long-term toxicities of radiation therapy in this situation with the patient and her daughter. She appears to understand and wishes to proceed with planned course of treatment.  PLAN:  Patient is scheduled for simulation and  planning on January 22 with treatments to begin approximately a week later.   Since patient has had a hysterectomy I am recommending intensity modulated radiation therapy for her external beam treatment to reduce dose to the small bowel.  Anticipate 5 weeks of external beam radiation therapy followed by 3 intracavitary brachytherapy treatments directed at the the proximal vagina  (vaginal cuff).   ------------------------------------------------  Blair Promise, PhD, MD

## 2017-05-27 ENCOUNTER — Ambulatory Visit
Admission: RE | Admit: 2017-05-27 | Discharge: 2017-05-27 | Disposition: A | Discharge status: Home / Self Care | Payer: Medicare Other | Source: Ambulatory Visit | Attending: Radiation Oncology | Admitting: Radiation Oncology

## 2017-05-27 ENCOUNTER — Encounter: Payer: Self-pay | Admitting: General Practice

## 2017-05-27 ENCOUNTER — Other Ambulatory Visit: Payer: Self-pay

## 2017-05-27 ENCOUNTER — Encounter: Payer: Self-pay | Admitting: Radiation Oncology

## 2017-05-27 VITALS — BP 116/70 | HR 77 | Temp 98.3°F | Ht 62.0 in | Wt 103.4 lb

## 2017-05-27 DIAGNOSIS — Z79899 Other long term (current) drug therapy: Secondary | ICD-10-CM | POA: Diagnosis not present

## 2017-05-27 DIAGNOSIS — E119 Type 2 diabetes mellitus without complications: Secondary | ICD-10-CM | POA: Diagnosis not present

## 2017-05-27 DIAGNOSIS — Z7984 Long term (current) use of oral hypoglycemic drugs: Secondary | ICD-10-CM | POA: Insufficient documentation

## 2017-05-27 DIAGNOSIS — C541 Malignant neoplasm of endometrium: Secondary | ICD-10-CM

## 2017-05-27 DIAGNOSIS — Z803 Family history of malignant neoplasm of breast: Secondary | ICD-10-CM | POA: Diagnosis not present

## 2017-05-27 DIAGNOSIS — Z90710 Acquired absence of both cervix and uterus: Secondary | ICD-10-CM | POA: Diagnosis not present

## 2017-05-27 DIAGNOSIS — Z510 Encounter for antineoplastic radiation therapy: Secondary | ICD-10-CM | POA: Insufficient documentation

## 2017-05-27 HISTORY — DX: Malignant neoplasm of endometrium: C54.1

## 2017-05-27 LAB — CBC WITH DIFFERENTIAL (CANCER CENTER ONLY)
Basophils Absolute: 0 10*3/uL (ref 0.0–0.1)
Basophils Relative: 0 %
EOS ABS: 0.1 10*3/uL (ref 0.0–0.5)
Eosinophils Relative: 1 %
HCT: 37.8 % (ref 34.8–46.6)
HEMOGLOBIN: 12.5 g/dL (ref 11.6–15.9)
LYMPHS ABS: 2.2 10*3/uL (ref 0.9–3.3)
LYMPHS PCT: 32 %
MCH: 30.9 pg (ref 25.1–34.0)
MCHC: 33.1 g/dL (ref 31.5–36.0)
MCV: 93.6 fL (ref 79.5–101.0)
MONOS PCT: 8 %
Monocytes Absolute: 0.6 10*3/uL (ref 0.1–0.9)
NEUTROS PCT: 59 %
Neutro Abs: 4 10*3/uL (ref 1.5–6.5)
Platelet Count: 328 10*3/uL (ref 145–400)
RBC: 4.04 MIL/uL (ref 3.70–5.45)
RDW: 12.7 % (ref 11.2–16.1)
WBC: 7 10*3/uL (ref 3.9–10.3)

## 2017-05-27 LAB — BASIC METABOLIC PANEL - CANCER CENTER ONLY
ANION GAP: 11 (ref 3–11)
BUN: 10 mg/dL (ref 7–26)
CHLORIDE: 101 mmol/L (ref 98–109)
CO2: 26 mmol/L (ref 22–29)
Calcium: 9.6 mg/dL (ref 8.4–10.4)
Creatinine: 0.69 mg/dL (ref 0.60–1.10)
GFR, Estimated: >60 mL/min (ref >60)
Glucose, Bld: 87 mg/dL (ref 70–140)
POTASSIUM: 4.4 mmol/L (ref 3.3–4.7)
SODIUM: 138 mmol/L (ref 136–145)

## 2017-05-27 MED ORDER — LORAZEPAM 0.5 MG PO TABS: 0.5000 mg | ORAL_TABLET | Freq: Three times a day (TID) | ORAL | 0 refills | Status: DC

## 2017-05-27 NOTE — Progress Notes (Signed)
[place] Psychosocial Distress Screening Clinical Social Work  Clinical Social Work was referred by distress screening protocol.  The patient scored a 5 on the Psychosocial Distress Thermometer which indicates moderate distress. Clinical Social Worker Edwyna Shell to assess for distress and other psychosocial needs. CSW and patient discussed common feeling and emotions when being diagnosed with cancer, and the importance of support during treatment. CSW informed patient of the support team and support services at [facility]. CSW provided contact information and encouraged patient to call with any questions or concerns.  Does not anticipate any concerns w transportation or self care during treatment.  Plans to have son/daughter help w transport if needed.  Wants to remain active/fit - wants to continue to exercise while in treatment as way to manage stress and maintain health.  "I really don't feel like I have cancer, I feel good."  Plans to continue w treatment at [facility], hopes to emerge "cancer free."  Information packet mailed w CSW contact information, encouraged to contact as needs arise.  ONCBCN DISTRESS SCREENING 05/27/2017  Distress experienced in past week (1-10) 5  Information Concerns Type Lack of info about treatment;Lack of info about maintaining fitness    Clinical Social Worker follow up needed: No.  If yes, follow up plan:   Edwyna Shell, LCSW Clinical Social Worker Phone:  [phone]

## 2017-05-27 NOTE — Progress Notes (Signed)
Advised patient not to take metformin on Tuesday before her CT SIM.  She verbalized understanding and agreement.

## 2017-05-28 NOTE — Progress Notes (Signed)
Has armband been applied?  yes  Does patient have an allergy to IV contrast dye?: No.   Has patient ever received premedication for IV contrast dye?: No.   Does patient take metformin?: Yes.    If patient does take metformin when was the last dose: last night on 06/01/16 at 6:00pm   Date of lab work: May 27, 2017 BUN: 10 CR: 0.69  IV site: right antecubital  Has IV site been added to flowsheet?  yes  There were no vitals taken for this visit.

## 2017-06-01 ENCOUNTER — Ambulatory Visit: Payer: Medicare Other | Admitting: Radiation Oncology

## 2017-06-01 ENCOUNTER — Telehealth: Payer: Self-pay | Admitting: Oncology

## 2017-06-01 ENCOUNTER — Ambulatory Visit: Payer: Medicare Other

## 2017-06-01 NOTE — Telephone Encounter (Signed)
Called patient and reminded her not to take metformin tomorrow morning before her IV start.  She verbalized understanding and agreement.

## 2017-06-02 ENCOUNTER — Ambulatory Visit
Admission: RE | Admit: 2017-06-02 | Discharge: 2017-06-02 | Disposition: A | Discharge status: Home / Self Care | Payer: Medicare Other | Source: Ambulatory Visit | Attending: Radiation Oncology | Admitting: Radiation Oncology

## 2017-06-02 VITALS — BP 139/81 | HR 69 | Temp 97.9°F | Wt 105.0 lb

## 2017-06-02 DIAGNOSIS — C541 Malignant neoplasm of endometrium: Secondary | ICD-10-CM

## 2017-06-02 DIAGNOSIS — Z510 Encounter for antineoplastic radiation therapy: Secondary | ICD-10-CM | POA: Diagnosis not present

## 2017-06-02 DIAGNOSIS — Z7984 Long term (current) use of oral hypoglycemic drugs: Secondary | ICD-10-CM | POA: Diagnosis not present

## 2017-06-02 DIAGNOSIS — Z803 Family history of malignant neoplasm of breast: Secondary | ICD-10-CM | POA: Diagnosis not present

## 2017-06-02 DIAGNOSIS — E119 Type 2 diabetes mellitus without complications: Secondary | ICD-10-CM | POA: Diagnosis not present

## 2017-06-02 DIAGNOSIS — Z79899 Other long term (current) drug therapy: Secondary | ICD-10-CM | POA: Diagnosis not present

## 2017-06-02 MED ORDER — SODIUM CHLORIDE 0.9% FLUSH
10.0000 mL | Freq: Once | INTRAVENOUS | Status: AC
  Administered 2017-06-02: 10 mL via INTRAVENOUS

## 2017-06-03 ENCOUNTER — Telehealth: Payer: Self-pay | Admitting: Oncology

## 2017-06-03 NOTE — Telephone Encounter (Signed)
Called patient to see if she would be available for an exam after her lab appointment at 3 tomorrow.  She agreed and appointment was made for 3:30 on 06/03/17.

## 2017-06-04 ENCOUNTER — Ambulatory Visit
Admission: RE | Admit: 2017-06-04 | Discharge: 2017-06-04 | Disposition: A | Discharge status: Home / Self Care | Payer: Medicare Other | Source: Ambulatory Visit | Attending: Radiation Oncology | Admitting: Radiation Oncology

## 2017-06-04 ENCOUNTER — Other Ambulatory Visit: Payer: Self-pay

## 2017-06-04 ENCOUNTER — Other Ambulatory Visit: Payer: Medicare Other

## 2017-06-04 ENCOUNTER — Encounter: Payer: Self-pay | Admitting: Radiation Oncology

## 2017-06-04 VITALS — BP 135/85 | HR 69 | Temp 98.2°F | Resp 18 | Ht 62.0 in | Wt 104.4 lb

## 2017-06-04 DIAGNOSIS — C541 Malignant neoplasm of endometrium: Secondary | ICD-10-CM | POA: Diagnosis not present

## 2017-06-04 DIAGNOSIS — Z79899 Other long term (current) drug therapy: Secondary | ICD-10-CM | POA: Diagnosis not present

## 2017-06-04 DIAGNOSIS — Z7984 Long term (current) use of oral hypoglycemic drugs: Secondary | ICD-10-CM | POA: Diagnosis not present

## 2017-06-04 DIAGNOSIS — Z803 Family history of malignant neoplasm of breast: Secondary | ICD-10-CM | POA: Diagnosis not present

## 2017-06-04 DIAGNOSIS — E119 Type 2 diabetes mellitus without complications: Secondary | ICD-10-CM | POA: Diagnosis not present

## 2017-06-04 DIAGNOSIS — Z510 Encounter for antineoplastic radiation therapy: Secondary | ICD-10-CM | POA: Diagnosis not present

## 2017-06-04 LAB — BUN & CREATININE (CHCC)
BUN: 14 mg/dL (ref 7–26)
CREATININE: 0.69 mg/dL (ref 0.60–1.10)
GFR, Estimated: >60 mL/min (ref >60)

## 2017-06-04 NOTE — Progress Notes (Signed)
  Radiation Oncology         [phone] ________________________________  Name: Kimberly [name] MRN: [id number]  Date: 06/04/2017  DOB: Apr 28, 1951  Planning note  CC: Marton Redwood, MD  Marton Redwood, MD    ICD-10-CM   1. Endometrial cancer determined by uterine biopsy (HCC) C54.1     Diagnosis:   Stage IB, grade 3 endometrioid adenocarcinoma of the endometrium, multifocal lymphovascular space invasion, isolated tumor cells within left external iliac lymph node chain.   Narrative:  The patient returns today for blood work and pelvic exam to assess the adequacy of vaginal cuff in preparation for radiation therapy.                             ALLERGIES:  is allergic to sulfur.  Meds: Current Outpatient Medications  Medication Sig Dispense Refill  . rosuvastatin (CRESTOR) 10 MG tablet Take 10 mg by mouth daily.    Marland Kitchen zolpidem (AMBIEN CR) 12.5 MG CR tablet Take 12.5 mg by mouth at bedtime as needed for sleep.    Marland Kitchen LORazepam (ATIVAN) 0.5 MG tablet Take 1 tablet (0.5 mg total) by mouth every 8 (eight) hours. (Patient not taking: Reported on 06/04/2017) 30 tablet 0  . metFORMIN (GLUCOPHAGE) 1000 MG tablet Take 1,000 mg by mouth 2 (two) times daily with a meal.     Current Facility-Administered Medications  Medication Dose Route Frequency Provider Last Rate Last Dose  . 0.9 %  sodium chloride infusion  500 mL Intravenous Continuous Milus Banister, MD        Physical Findings: The patient is in no acute distress. Patient is alert and oriented.  height is 5\' 2"  (1.575 m) and weight is 104 lb 6.4 oz (47.4 kg). Her oral temperature is 98.2 F (36.8 C). Her blood pressure is 135/85 and her pulse is 69. Her respiration is 18 and oxygen saturation is 100%. . On pelvic examination the external genitalia were unremarkable. A speculum exam was performed. There are no mucosal lesions noted in the vaginal vault.  On bimanual  examination there were no pelvic masses appreciated. Vaginal cuff  intact.  Lab Findings: Lab Results  Component Value Date   WBC 7.0 05/27/2017   HCT 37.8 05/27/2017   MCV 93.6 05/27/2017   PLT 328 05/27/2017    Radiographic Findings: No results found.  Impression:  The patient has recovered well from her surgery and would be ready to proceed proceed with the radiation therapy directed at the pelvis next week as planned.    ____________________________________ Gery Pray, MD

## 2017-06-07 NOTE — Progress Notes (Signed)
  Radiation Oncology         (336) [phone] ________________________________  Name: Kimberly [name] MRN: [id number]  Date: 06/02/2017  DOB: 19-Mar-1951  SIMULATION AND TREATMENT PLANNING NOTE    ICD-10-CM   1. Endometrial cancer determined by uterine biopsy (HCC) C54.1     DIAGNOSIS:  Stage IB, grade 3 endometrioid adenocarcinoma of the endometrium, multifocal lymphovascular space invasion, isolated tumor cells within left external iliac lymph node chain.    NARRATIVE:  The patient was brought to the [facility].  Identity was confirmed.  All relevant records and images related to the planned course of therapy were reviewed.  The patient freely provided informed written consent to proceed with treatment after reviewing the details related to the planned course of therapy. The consent form was witnessed and verified by the simulation staff.  Then, the patient was set-up in a stable reproducible  supine position for radiation therapy.  CT images were obtained.  Surface markings were placed.  The CT images were loaded into the planning software.  Then the target and avoidance structures were contoured.  Treatment planning then occurred.  The radiation prescription was entered and confirmed.  Then, I designed and supervised the construction of a total of 2 medically necessary complex treatment devices.  I have requested : Intensity Modulated Radiotherapy (IMRT) is medically necessary for this case for the following reason:  Small bowel sparing..  I have ordered:CBC  PLAN:  The patient will receive 45 Gy in 25 fractions directed at the pelvis area. The patient will then proceed with 3 intracavitary brachytherapy treatments directed at the vaginal cuff using iridium 192 as the high-dose-rate source.  -----------------------------------  Blair Promise, PhD, MD

## 2017-06-09 DIAGNOSIS — E119 Type 2 diabetes mellitus without complications: Secondary | ICD-10-CM | POA: Diagnosis not present

## 2017-06-09 DIAGNOSIS — Z7984 Long term (current) use of oral hypoglycemic drugs: Secondary | ICD-10-CM | POA: Diagnosis not present

## 2017-06-09 DIAGNOSIS — Z803 Family history of malignant neoplasm of breast: Secondary | ICD-10-CM | POA: Diagnosis not present

## 2017-06-09 DIAGNOSIS — C541 Malignant neoplasm of endometrium: Secondary | ICD-10-CM | POA: Diagnosis not present

## 2017-06-09 DIAGNOSIS — Z79899 Other long term (current) drug therapy: Secondary | ICD-10-CM | POA: Diagnosis not present

## 2017-06-09 DIAGNOSIS — Z510 Encounter for antineoplastic radiation therapy: Secondary | ICD-10-CM | POA: Diagnosis not present

## 2017-06-11 ENCOUNTER — Ambulatory Visit
Admission: RE | Admit: 2017-06-11 | Discharge: 2017-06-11 | Disposition: A | Discharge status: Home / Self Care | Payer: Medicare Other | Source: Ambulatory Visit | Attending: Radiation Oncology | Admitting: Radiation Oncology

## 2017-06-11 DIAGNOSIS — C541 Malignant neoplasm of endometrium: Secondary | ICD-10-CM

## 2017-06-11 DIAGNOSIS — Z510 Encounter for antineoplastic radiation therapy: Secondary | ICD-10-CM | POA: Diagnosis not present

## 2017-06-11 DIAGNOSIS — Z7984 Long term (current) use of oral hypoglycemic drugs: Secondary | ICD-10-CM | POA: Diagnosis not present

## 2017-06-11 DIAGNOSIS — E119 Type 2 diabetes mellitus without complications: Secondary | ICD-10-CM | POA: Diagnosis not present

## 2017-06-11 DIAGNOSIS — Z79899 Other long term (current) drug therapy: Secondary | ICD-10-CM | POA: Diagnosis not present

## 2017-06-11 DIAGNOSIS — Z803 Family history of malignant neoplasm of breast: Secondary | ICD-10-CM | POA: Diagnosis not present

## 2017-06-11 NOTE — Progress Notes (Signed)
  Radiation Oncology         (336) [phone] ________________________________  Name: Kimberly [name] MRN: [id number]  Date: 06/11/2017  DOB: 01/21/1951  Simulation Verification Note    ICD-10-CM   1. Endometrial cancer determined by uterine biopsy ([place]) C54.1     Status: outpatient  NARRATIVE: The patient was brought to the treatment unit and placed in the planned treatment position. The clinical setup was verified. Then port films were obtained and uploaded to the radiation oncology medical record software.  The treatment beams were carefully compared against the planned radiation fields. The position location and shape of the radiation fields was reviewed. They targeted volume of tissue appears to be appropriately covered by the radiation beams. Organs at risk appear to be excluded as planned.  Based on my personal review, I approved the simulation verification. The patient's treatment will proceed as planned.  -----------------------------------  Blair Promise, PhD, MD

## 2017-06-12 ENCOUNTER — Ambulatory Visit
Admission: RE | Admit: 2017-06-12 | Discharge: 2017-06-12 | Disposition: A | Discharge status: Home / Self Care | Payer: Medicare Other | Source: Ambulatory Visit | Attending: Radiation Oncology | Admitting: Radiation Oncology

## 2017-06-12 DIAGNOSIS — C541 Malignant neoplasm of endometrium: Secondary | ICD-10-CM | POA: Diagnosis not present

## 2017-06-12 DIAGNOSIS — Z510 Encounter for antineoplastic radiation therapy: Secondary | ICD-10-CM | POA: Diagnosis not present

## 2017-06-12 DIAGNOSIS — E119 Type 2 diabetes mellitus without complications: Secondary | ICD-10-CM | POA: Diagnosis not present

## 2017-06-12 DIAGNOSIS — Z803 Family history of malignant neoplasm of breast: Secondary | ICD-10-CM | POA: Diagnosis not present

## 2017-06-12 DIAGNOSIS — Z7984 Long term (current) use of oral hypoglycemic drugs: Secondary | ICD-10-CM | POA: Diagnosis not present

## 2017-06-12 DIAGNOSIS — Z79899 Other long term (current) drug therapy: Secondary | ICD-10-CM | POA: Diagnosis not present

## 2017-06-15 ENCOUNTER — Ambulatory Visit
Admission: RE | Admit: 2017-06-15 | Discharge: 2017-06-15 | Disposition: A | Discharge status: Home / Self Care | Payer: Medicare Other | Source: Ambulatory Visit | Attending: Radiation Oncology | Admitting: Radiation Oncology

## 2017-06-15 DIAGNOSIS — Z803 Family history of malignant neoplasm of breast: Secondary | ICD-10-CM | POA: Diagnosis not present

## 2017-06-15 DIAGNOSIS — C541 Malignant neoplasm of endometrium: Secondary | ICD-10-CM | POA: Diagnosis not present

## 2017-06-15 DIAGNOSIS — E119 Type 2 diabetes mellitus without complications: Secondary | ICD-10-CM | POA: Diagnosis not present

## 2017-06-15 DIAGNOSIS — Z79899 Other long term (current) drug therapy: Secondary | ICD-10-CM | POA: Diagnosis not present

## 2017-06-15 DIAGNOSIS — Z7984 Long term (current) use of oral hypoglycemic drugs: Secondary | ICD-10-CM | POA: Diagnosis not present

## 2017-06-15 DIAGNOSIS — Z510 Encounter for antineoplastic radiation therapy: Secondary | ICD-10-CM | POA: Diagnosis not present

## 2017-06-16 ENCOUNTER — Ambulatory Visit
Admission: RE | Admit: 2017-06-16 | Discharge: 2017-06-16 | Disposition: A | Discharge status: Home / Self Care | Payer: Medicare Other | Source: Ambulatory Visit | Attending: Radiation Oncology | Admitting: Radiation Oncology

## 2017-06-16 DIAGNOSIS — Z7984 Long term (current) use of oral hypoglycemic drugs: Secondary | ICD-10-CM | POA: Diagnosis not present

## 2017-06-16 DIAGNOSIS — E119 Type 2 diabetes mellitus without complications: Secondary | ICD-10-CM | POA: Diagnosis not present

## 2017-06-16 DIAGNOSIS — C541 Malignant neoplasm of endometrium: Secondary | ICD-10-CM

## 2017-06-16 DIAGNOSIS — Z79899 Other long term (current) drug therapy: Secondary | ICD-10-CM | POA: Diagnosis not present

## 2017-06-16 DIAGNOSIS — Z510 Encounter for antineoplastic radiation therapy: Secondary | ICD-10-CM | POA: Diagnosis not present

## 2017-06-16 DIAGNOSIS — Z803 Family history of malignant neoplasm of breast: Secondary | ICD-10-CM | POA: Diagnosis not present

## 2017-06-16 MED ORDER — SONAFINE EX EMUL
1.0000 "application " | Freq: Two times a day (BID) | CUTANEOUS | Status: DC
  Administered 2017-06-16: 1 via TOPICAL

## 2017-06-16 NOTE — Progress Notes (Signed)
Pt here for patient teaching.  Pt given Radiation and You booklet, skin care instructions and Sonafine.  Reviewed areas of pertinence such as diarrhea, fatigue, nausea and vomiting, skin changes and urinary and bladder changes . Pt able to give teach back of to pat skin and use unscented/gentle soap,apply Sonafine bid and avoid applying anything to skin within 4 hours of treatment. Pt demonstrated understanding, needs reinforcement, no evidence of learning, refused teaching and  of information given and will contact nursing with any questions or concerns.     Http://[email or web address]/treatmentinformation/whattoexpect/index

## 2017-06-17 ENCOUNTER — Ambulatory Visit
Admission: RE | Admit: 2017-06-17 | Discharge: 2017-06-17 | Disposition: A | Discharge status: Home / Self Care | Payer: Medicare Other | Source: Ambulatory Visit | Attending: Radiation Oncology | Admitting: Radiation Oncology

## 2017-06-17 DIAGNOSIS — C541 Malignant neoplasm of endometrium: Secondary | ICD-10-CM | POA: Diagnosis not present

## 2017-06-17 DIAGNOSIS — Z510 Encounter for antineoplastic radiation therapy: Secondary | ICD-10-CM | POA: Diagnosis not present

## 2017-06-17 DIAGNOSIS — Z79899 Other long term (current) drug therapy: Secondary | ICD-10-CM | POA: Diagnosis not present

## 2017-06-17 DIAGNOSIS — E119 Type 2 diabetes mellitus without complications: Secondary | ICD-10-CM | POA: Diagnosis not present

## 2017-06-17 DIAGNOSIS — Z803 Family history of malignant neoplasm of breast: Secondary | ICD-10-CM | POA: Diagnosis not present

## 2017-06-17 DIAGNOSIS — Z7984 Long term (current) use of oral hypoglycemic drugs: Secondary | ICD-10-CM | POA: Diagnosis not present

## 2017-06-18 ENCOUNTER — Ambulatory Visit
Admission: RE | Admit: 2017-06-18 | Discharge: 2017-06-18 | Disposition: A | Discharge status: Home / Self Care | Payer: Medicare Other | Source: Ambulatory Visit | Attending: Radiation Oncology | Admitting: Radiation Oncology

## 2017-06-18 DIAGNOSIS — E119 Type 2 diabetes mellitus without complications: Secondary | ICD-10-CM | POA: Diagnosis not present

## 2017-06-18 DIAGNOSIS — C541 Malignant neoplasm of endometrium: Secondary | ICD-10-CM | POA: Diagnosis not present

## 2017-06-18 DIAGNOSIS — Z803 Family history of malignant neoplasm of breast: Secondary | ICD-10-CM | POA: Diagnosis not present

## 2017-06-18 DIAGNOSIS — Z7984 Long term (current) use of oral hypoglycemic drugs: Secondary | ICD-10-CM | POA: Diagnosis not present

## 2017-06-18 DIAGNOSIS — Z510 Encounter for antineoplastic radiation therapy: Secondary | ICD-10-CM | POA: Diagnosis not present

## 2017-06-18 DIAGNOSIS — Z79899 Other long term (current) drug therapy: Secondary | ICD-10-CM | POA: Diagnosis not present

## 2017-06-19 ENCOUNTER — Ambulatory Visit
Admission: RE | Admit: 2017-06-19 | Discharge: 2017-06-19 | Disposition: A | Discharge status: Home / Self Care | Payer: Medicare Other | Source: Ambulatory Visit | Attending: Radiation Oncology | Admitting: Radiation Oncology

## 2017-06-19 DIAGNOSIS — Z79899 Other long term (current) drug therapy: Secondary | ICD-10-CM | POA: Diagnosis not present

## 2017-06-19 DIAGNOSIS — Z510 Encounter for antineoplastic radiation therapy: Secondary | ICD-10-CM | POA: Diagnosis not present

## 2017-06-19 DIAGNOSIS — Z803 Family history of malignant neoplasm of breast: Secondary | ICD-10-CM | POA: Diagnosis not present

## 2017-06-19 DIAGNOSIS — Z7984 Long term (current) use of oral hypoglycemic drugs: Secondary | ICD-10-CM | POA: Diagnosis not present

## 2017-06-19 DIAGNOSIS — C541 Malignant neoplasm of endometrium: Secondary | ICD-10-CM | POA: Diagnosis not present

## 2017-06-19 DIAGNOSIS — E119 Type 2 diabetes mellitus without complications: Secondary | ICD-10-CM | POA: Diagnosis not present

## 2017-06-22 ENCOUNTER — Ambulatory Visit
Admission: RE | Admit: 2017-06-22 | Discharge: 2017-06-22 | Disposition: A | Discharge status: Home / Self Care | Payer: Medicare Other | Source: Ambulatory Visit | Attending: Radiation Oncology | Admitting: Radiation Oncology

## 2017-06-22 DIAGNOSIS — E119 Type 2 diabetes mellitus without complications: Secondary | ICD-10-CM | POA: Diagnosis not present

## 2017-06-22 DIAGNOSIS — Z803 Family history of malignant neoplasm of breast: Secondary | ICD-10-CM | POA: Diagnosis not present

## 2017-06-22 DIAGNOSIS — Z510 Encounter for antineoplastic radiation therapy: Secondary | ICD-10-CM | POA: Diagnosis not present

## 2017-06-22 DIAGNOSIS — Z7984 Long term (current) use of oral hypoglycemic drugs: Secondary | ICD-10-CM | POA: Diagnosis not present

## 2017-06-22 DIAGNOSIS — Z79899 Other long term (current) drug therapy: Secondary | ICD-10-CM | POA: Diagnosis not present

## 2017-06-22 DIAGNOSIS — C541 Malignant neoplasm of endometrium: Secondary | ICD-10-CM | POA: Diagnosis not present

## 2017-06-23 ENCOUNTER — Ambulatory Visit
Admission: RE | Admit: 2017-06-23 | Discharge: 2017-06-23 | Disposition: A | Discharge status: Home / Self Care | Payer: Medicare Other | Source: Ambulatory Visit | Attending: Radiation Oncology | Admitting: Radiation Oncology

## 2017-06-23 DIAGNOSIS — E119 Type 2 diabetes mellitus without complications: Secondary | ICD-10-CM | POA: Diagnosis not present

## 2017-06-23 DIAGNOSIS — Z7984 Long term (current) use of oral hypoglycemic drugs: Secondary | ICD-10-CM | POA: Diagnosis not present

## 2017-06-23 DIAGNOSIS — C541 Malignant neoplasm of endometrium: Secondary | ICD-10-CM | POA: Diagnosis not present

## 2017-06-23 DIAGNOSIS — Z510 Encounter for antineoplastic radiation therapy: Secondary | ICD-10-CM | POA: Diagnosis not present

## 2017-06-23 DIAGNOSIS — Z79899 Other long term (current) drug therapy: Secondary | ICD-10-CM | POA: Diagnosis not present

## 2017-06-23 DIAGNOSIS — Z803 Family history of malignant neoplasm of breast: Secondary | ICD-10-CM | POA: Diagnosis not present

## 2017-06-24 ENCOUNTER — Ambulatory Visit
Admission: RE | Admit: 2017-06-24 | Discharge: 2017-06-24 | Disposition: A | Discharge status: Home / Self Care | Payer: Medicare Other | Source: Ambulatory Visit | Attending: Radiation Oncology | Admitting: Radiation Oncology

## 2017-06-24 DIAGNOSIS — Z79899 Other long term (current) drug therapy: Secondary | ICD-10-CM | POA: Diagnosis not present

## 2017-06-24 DIAGNOSIS — Z803 Family history of malignant neoplasm of breast: Secondary | ICD-10-CM | POA: Diagnosis not present

## 2017-06-24 DIAGNOSIS — Z7984 Long term (current) use of oral hypoglycemic drugs: Secondary | ICD-10-CM | POA: Diagnosis not present

## 2017-06-24 DIAGNOSIS — Z510 Encounter for antineoplastic radiation therapy: Secondary | ICD-10-CM | POA: Diagnosis not present

## 2017-06-24 DIAGNOSIS — E119 Type 2 diabetes mellitus without complications: Secondary | ICD-10-CM | POA: Diagnosis not present

## 2017-06-24 DIAGNOSIS — C541 Malignant neoplasm of endometrium: Secondary | ICD-10-CM | POA: Diagnosis not present

## 2017-06-25 ENCOUNTER — Ambulatory Visit
Admission: RE | Admit: 2017-06-25 | Discharge: 2017-06-25 | Disposition: A | Discharge status: Home / Self Care | Payer: Medicare Other | Source: Ambulatory Visit | Attending: Radiation Oncology | Admitting: Radiation Oncology

## 2017-06-25 DIAGNOSIS — Z7984 Long term (current) use of oral hypoglycemic drugs: Secondary | ICD-10-CM | POA: Diagnosis not present

## 2017-06-25 DIAGNOSIS — Z803 Family history of malignant neoplasm of breast: Secondary | ICD-10-CM | POA: Diagnosis not present

## 2017-06-25 DIAGNOSIS — Z510 Encounter for antineoplastic radiation therapy: Secondary | ICD-10-CM | POA: Diagnosis not present

## 2017-06-25 DIAGNOSIS — E119 Type 2 diabetes mellitus without complications: Secondary | ICD-10-CM | POA: Diagnosis not present

## 2017-06-25 DIAGNOSIS — C541 Malignant neoplasm of endometrium: Secondary | ICD-10-CM | POA: Diagnosis not present

## 2017-06-25 DIAGNOSIS — Z79899 Other long term (current) drug therapy: Secondary | ICD-10-CM | POA: Diagnosis not present

## 2017-06-26 ENCOUNTER — Ambulatory Visit
Admission: RE | Admit: 2017-06-26 | Discharge: 2017-06-26 | Disposition: A | Discharge status: Home / Self Care | Payer: Medicare Other | Source: Ambulatory Visit | Attending: Radiation Oncology | Admitting: Radiation Oncology

## 2017-06-26 DIAGNOSIS — Z510 Encounter for antineoplastic radiation therapy: Secondary | ICD-10-CM | POA: Diagnosis not present

## 2017-06-26 DIAGNOSIS — Z7984 Long term (current) use of oral hypoglycemic drugs: Secondary | ICD-10-CM | POA: Diagnosis not present

## 2017-06-26 DIAGNOSIS — C541 Malignant neoplasm of endometrium: Secondary | ICD-10-CM | POA: Diagnosis not present

## 2017-06-26 DIAGNOSIS — E119 Type 2 diabetes mellitus without complications: Secondary | ICD-10-CM | POA: Diagnosis not present

## 2017-06-26 DIAGNOSIS — Z803 Family history of malignant neoplasm of breast: Secondary | ICD-10-CM | POA: Diagnosis not present

## 2017-06-26 DIAGNOSIS — Z79899 Other long term (current) drug therapy: Secondary | ICD-10-CM | POA: Diagnosis not present

## 2017-06-29 ENCOUNTER — Ambulatory Visit
Admission: RE | Admit: 2017-06-29 | Discharge: 2017-06-29 | Disposition: A | Discharge status: Home / Self Care | Payer: Medicare Other | Source: Ambulatory Visit | Attending: Radiation Oncology | Admitting: Radiation Oncology

## 2017-06-29 DIAGNOSIS — Z803 Family history of malignant neoplasm of breast: Secondary | ICD-10-CM | POA: Diagnosis not present

## 2017-06-29 DIAGNOSIS — C541 Malignant neoplasm of endometrium: Secondary | ICD-10-CM | POA: Diagnosis not present

## 2017-06-29 DIAGNOSIS — Z7984 Long term (current) use of oral hypoglycemic drugs: Secondary | ICD-10-CM | POA: Diagnosis not present

## 2017-06-29 DIAGNOSIS — E119 Type 2 diabetes mellitus without complications: Secondary | ICD-10-CM | POA: Diagnosis not present

## 2017-06-29 DIAGNOSIS — Z79899 Other long term (current) drug therapy: Secondary | ICD-10-CM | POA: Diagnosis not present

## 2017-06-29 DIAGNOSIS — Z510 Encounter for antineoplastic radiation therapy: Secondary | ICD-10-CM | POA: Diagnosis not present

## 2017-06-30 ENCOUNTER — Ambulatory Visit
Admission: RE | Admit: 2017-06-30 | Discharge: 2017-06-30 | Disposition: A | Discharge status: Home / Self Care | Payer: Medicare Other | Source: Ambulatory Visit | Attending: Radiation Oncology | Admitting: Radiation Oncology

## 2017-06-30 DIAGNOSIS — Z803 Family history of malignant neoplasm of breast: Secondary | ICD-10-CM | POA: Diagnosis not present

## 2017-06-30 DIAGNOSIS — Z79899 Other long term (current) drug therapy: Secondary | ICD-10-CM | POA: Diagnosis not present

## 2017-06-30 DIAGNOSIS — Z510 Encounter for antineoplastic radiation therapy: Secondary | ICD-10-CM | POA: Diagnosis not present

## 2017-06-30 DIAGNOSIS — E119 Type 2 diabetes mellitus without complications: Secondary | ICD-10-CM | POA: Diagnosis not present

## 2017-06-30 DIAGNOSIS — Z7984 Long term (current) use of oral hypoglycemic drugs: Secondary | ICD-10-CM | POA: Diagnosis not present

## 2017-06-30 DIAGNOSIS — C541 Malignant neoplasm of endometrium: Secondary | ICD-10-CM | POA: Diagnosis not present

## 2017-07-01 ENCOUNTER — Ambulatory Visit
Admission: RE | Admit: 2017-07-01 | Discharge: 2017-07-01 | Disposition: A | Discharge status: Home / Self Care | Payer: Medicare Other | Source: Ambulatory Visit | Attending: Radiation Oncology | Admitting: Radiation Oncology

## 2017-07-01 DIAGNOSIS — C541 Malignant neoplasm of endometrium: Secondary | ICD-10-CM | POA: Diagnosis not present

## 2017-07-01 DIAGNOSIS — Z803 Family history of malignant neoplasm of breast: Secondary | ICD-10-CM | POA: Diagnosis not present

## 2017-07-01 DIAGNOSIS — Z510 Encounter for antineoplastic radiation therapy: Secondary | ICD-10-CM | POA: Diagnosis not present

## 2017-07-01 DIAGNOSIS — E119 Type 2 diabetes mellitus without complications: Secondary | ICD-10-CM | POA: Diagnosis not present

## 2017-07-01 DIAGNOSIS — Z7984 Long term (current) use of oral hypoglycemic drugs: Secondary | ICD-10-CM | POA: Diagnosis not present

## 2017-07-01 DIAGNOSIS — Z79899 Other long term (current) drug therapy: Secondary | ICD-10-CM | POA: Diagnosis not present

## 2017-07-02 ENCOUNTER — Ambulatory Visit
Admission: RE | Admit: 2017-07-02 | Discharge: 2017-07-02 | Disposition: A | Discharge status: Home / Self Care | Payer: Medicare Other | Source: Ambulatory Visit | Attending: Radiation Oncology | Admitting: Radiation Oncology

## 2017-07-02 DIAGNOSIS — C541 Malignant neoplasm of endometrium: Secondary | ICD-10-CM | POA: Diagnosis not present

## 2017-07-02 DIAGNOSIS — Z803 Family history of malignant neoplasm of breast: Secondary | ICD-10-CM | POA: Diagnosis not present

## 2017-07-02 DIAGNOSIS — Z510 Encounter for antineoplastic radiation therapy: Secondary | ICD-10-CM | POA: Diagnosis not present

## 2017-07-02 DIAGNOSIS — Z79899 Other long term (current) drug therapy: Secondary | ICD-10-CM | POA: Diagnosis not present

## 2017-07-02 DIAGNOSIS — E119 Type 2 diabetes mellitus without complications: Secondary | ICD-10-CM | POA: Diagnosis not present

## 2017-07-02 DIAGNOSIS — Z7984 Long term (current) use of oral hypoglycemic drugs: Secondary | ICD-10-CM | POA: Diagnosis not present

## 2017-07-03 ENCOUNTER — Ambulatory Visit
Admission: RE | Admit: 2017-07-03 | Discharge: 2017-07-03 | Disposition: A | Discharge status: Home / Self Care | Payer: Medicare Other | Source: Ambulatory Visit | Attending: Radiation Oncology | Admitting: Radiation Oncology

## 2017-07-03 DIAGNOSIS — Z803 Family history of malignant neoplasm of breast: Secondary | ICD-10-CM | POA: Diagnosis not present

## 2017-07-03 DIAGNOSIS — E119 Type 2 diabetes mellitus without complications: Secondary | ICD-10-CM | POA: Diagnosis not present

## 2017-07-03 DIAGNOSIS — Z510 Encounter for antineoplastic radiation therapy: Secondary | ICD-10-CM | POA: Diagnosis not present

## 2017-07-03 DIAGNOSIS — Z7984 Long term (current) use of oral hypoglycemic drugs: Secondary | ICD-10-CM | POA: Diagnosis not present

## 2017-07-03 DIAGNOSIS — L821 Other seborrheic keratosis: Secondary | ICD-10-CM | POA: Diagnosis not present

## 2017-07-03 DIAGNOSIS — C541 Malignant neoplasm of endometrium: Secondary | ICD-10-CM | POA: Diagnosis not present

## 2017-07-03 DIAGNOSIS — Z79899 Other long term (current) drug therapy: Secondary | ICD-10-CM | POA: Diagnosis not present

## 2017-07-03 DIAGNOSIS — C4402 Squamous cell carcinoma of skin of lip: Secondary | ICD-10-CM | POA: Diagnosis not present

## 2017-07-03 DIAGNOSIS — C44329 Squamous cell carcinoma of skin of other parts of face: Secondary | ICD-10-CM | POA: Diagnosis not present

## 2017-07-06 ENCOUNTER — Ambulatory Visit
Admission: RE | Admit: 2017-07-06 | Discharge: 2017-07-06 | Disposition: A | Discharge status: Home / Self Care | Payer: Medicare Other | Source: Ambulatory Visit | Attending: Radiation Oncology | Admitting: Radiation Oncology

## 2017-07-06 DIAGNOSIS — Z7984 Long term (current) use of oral hypoglycemic drugs: Secondary | ICD-10-CM | POA: Diagnosis not present

## 2017-07-06 DIAGNOSIS — C541 Malignant neoplasm of endometrium: Secondary | ICD-10-CM | POA: Diagnosis not present

## 2017-07-06 DIAGNOSIS — Z803 Family history of malignant neoplasm of breast: Secondary | ICD-10-CM | POA: Diagnosis not present

## 2017-07-06 DIAGNOSIS — Z510 Encounter for antineoplastic radiation therapy: Secondary | ICD-10-CM | POA: Diagnosis not present

## 2017-07-06 DIAGNOSIS — E119 Type 2 diabetes mellitus without complications: Secondary | ICD-10-CM | POA: Diagnosis not present

## 2017-07-06 DIAGNOSIS — Z79899 Other long term (current) drug therapy: Secondary | ICD-10-CM | POA: Diagnosis not present

## 2017-07-07 ENCOUNTER — Ambulatory Visit
Admission: RE | Admit: 2017-07-07 | Discharge: 2017-07-07 | Disposition: A | Discharge status: Home / Self Care | Payer: Medicare Other | Source: Ambulatory Visit | Attending: Radiation Oncology | Admitting: Radiation Oncology

## 2017-07-07 DIAGNOSIS — E119 Type 2 diabetes mellitus without complications: Secondary | ICD-10-CM | POA: Diagnosis not present

## 2017-07-07 DIAGNOSIS — Z803 Family history of malignant neoplasm of breast: Secondary | ICD-10-CM | POA: Diagnosis not present

## 2017-07-07 DIAGNOSIS — Z510 Encounter for antineoplastic radiation therapy: Secondary | ICD-10-CM | POA: Diagnosis not present

## 2017-07-07 DIAGNOSIS — Z7984 Long term (current) use of oral hypoglycemic drugs: Secondary | ICD-10-CM | POA: Diagnosis not present

## 2017-07-07 DIAGNOSIS — C541 Malignant neoplasm of endometrium: Secondary | ICD-10-CM | POA: Diagnosis not present

## 2017-07-07 DIAGNOSIS — Z79899 Other long term (current) drug therapy: Secondary | ICD-10-CM | POA: Diagnosis not present

## 2017-07-08 ENCOUNTER — Ambulatory Visit
Admission: RE | Admit: 2017-07-08 | Discharge: 2017-07-08 | Disposition: A | Discharge status: Home / Self Care | Payer: Medicare Other | Source: Ambulatory Visit | Attending: Radiation Oncology | Admitting: Radiation Oncology

## 2017-07-08 DIAGNOSIS — E119 Type 2 diabetes mellitus without complications: Secondary | ICD-10-CM | POA: Diagnosis not present

## 2017-07-08 DIAGNOSIS — Z510 Encounter for antineoplastic radiation therapy: Secondary | ICD-10-CM | POA: Diagnosis not present

## 2017-07-08 DIAGNOSIS — Z7984 Long term (current) use of oral hypoglycemic drugs: Secondary | ICD-10-CM | POA: Diagnosis not present

## 2017-07-08 DIAGNOSIS — Z803 Family history of malignant neoplasm of breast: Secondary | ICD-10-CM | POA: Diagnosis not present

## 2017-07-08 DIAGNOSIS — C541 Malignant neoplasm of endometrium: Secondary | ICD-10-CM | POA: Diagnosis not present

## 2017-07-08 DIAGNOSIS — Z79899 Other long term (current) drug therapy: Secondary | ICD-10-CM | POA: Diagnosis not present

## 2017-07-09 ENCOUNTER — Ambulatory Visit
Admission: RE | Admit: 2017-07-09 | Discharge: 2017-07-09 | Disposition: A | Discharge status: Home / Self Care | Payer: Medicare Other | Source: Ambulatory Visit | Attending: Radiation Oncology | Admitting: Radiation Oncology

## 2017-07-09 DIAGNOSIS — Z79899 Other long term (current) drug therapy: Secondary | ICD-10-CM | POA: Diagnosis not present

## 2017-07-09 DIAGNOSIS — C541 Malignant neoplasm of endometrium: Secondary | ICD-10-CM | POA: Diagnosis not present

## 2017-07-09 DIAGNOSIS — Z510 Encounter for antineoplastic radiation therapy: Secondary | ICD-10-CM | POA: Diagnosis not present

## 2017-07-09 DIAGNOSIS — Z7984 Long term (current) use of oral hypoglycemic drugs: Secondary | ICD-10-CM | POA: Diagnosis not present

## 2017-07-09 DIAGNOSIS — Z803 Family history of malignant neoplasm of breast: Secondary | ICD-10-CM | POA: Diagnosis not present

## 2017-07-09 DIAGNOSIS — E119 Type 2 diabetes mellitus without complications: Secondary | ICD-10-CM | POA: Diagnosis not present

## 2017-07-10 ENCOUNTER — Telehealth: Payer: Self-pay | Admitting: *Deleted

## 2017-07-10 ENCOUNTER — Ambulatory Visit
Admission: RE | Admit: 2017-07-10 | Discharge: 2017-07-10 | Disposition: A | Discharge status: Home / Self Care | Payer: Medicare Other | Source: Ambulatory Visit | Attending: Radiation Oncology | Admitting: Radiation Oncology

## 2017-07-10 DIAGNOSIS — C541 Malignant neoplasm of endometrium: Secondary | ICD-10-CM | POA: Diagnosis not present

## 2017-07-10 DIAGNOSIS — C569 Malignant neoplasm of unspecified ovary: Secondary | ICD-10-CM | POA: Diagnosis not present

## 2017-07-10 NOTE — Telephone Encounter (Signed)
Patient called regarding if it was ok to wait to see Dr. Denman George until May. Patient stated that "Dr. Clarene Essex told me to see Dr.Rossi in three months." Per Melissa APP ok to keep the May appt, and Dr. Sondra Come will do pelvic exams.   Called the patient back and gave the above message. Patient verbalized understanding

## 2017-07-13 ENCOUNTER — Ambulatory Visit
Admission: RE | Admit: 2017-07-13 | Discharge: 2017-07-13 | Disposition: A | Discharge status: Home / Self Care | Payer: Medicare Other | Source: Ambulatory Visit | Attending: Radiation Oncology | Admitting: Radiation Oncology

## 2017-07-13 DIAGNOSIS — C541 Malignant neoplasm of endometrium: Secondary | ICD-10-CM | POA: Diagnosis not present

## 2017-07-13 DIAGNOSIS — C569 Malignant neoplasm of unspecified ovary: Secondary | ICD-10-CM | POA: Diagnosis not present

## 2017-07-14 ENCOUNTER — Ambulatory Visit
Admission: RE | Admit: 2017-07-14 | Discharge: 2017-07-14 | Disposition: A | Discharge status: Home / Self Care | Payer: Medicare Other | Source: Ambulatory Visit | Attending: Radiation Oncology | Admitting: Radiation Oncology

## 2017-07-14 DIAGNOSIS — C541 Malignant neoplasm of endometrium: Secondary | ICD-10-CM | POA: Diagnosis not present

## 2017-07-14 DIAGNOSIS — C569 Malignant neoplasm of unspecified ovary: Secondary | ICD-10-CM | POA: Diagnosis not present

## 2017-07-15 ENCOUNTER — Ambulatory Visit
Admission: RE | Admit: 2017-07-15 | Discharge: 2017-07-15 | Disposition: A | Discharge status: Home / Self Care | Payer: Medicare Other | Source: Ambulatory Visit | Attending: Radiation Oncology | Admitting: Radiation Oncology

## 2017-07-15 DIAGNOSIS — C569 Malignant neoplasm of unspecified ovary: Secondary | ICD-10-CM | POA: Diagnosis not present

## 2017-07-15 DIAGNOSIS — C541 Malignant neoplasm of endometrium: Secondary | ICD-10-CM | POA: Diagnosis not present

## 2017-07-16 DIAGNOSIS — C4402 Squamous cell carcinoma of skin of lip: Secondary | ICD-10-CM | POA: Diagnosis not present

## 2017-07-16 DIAGNOSIS — Z85828 Personal history of other malignant neoplasm of skin: Secondary | ICD-10-CM | POA: Diagnosis not present

## 2017-07-20 ENCOUNTER — Telehealth: Payer: Self-pay | Admitting: *Deleted

## 2017-07-20 NOTE — Telephone Encounter (Signed)
Called patient to remind of [facility], spoke with patient and she is aware of these appts.

## 2017-07-21 ENCOUNTER — Ambulatory Visit
Admission: RE | Admit: 2017-07-21 | Discharge: 2017-07-21 | Disposition: A | Discharge status: Home / Self Care | Payer: Medicare Other | Source: Ambulatory Visit | Attending: Radiation Oncology | Admitting: Radiation Oncology

## 2017-07-21 ENCOUNTER — Encounter: Payer: Self-pay | Admitting: Radiation Oncology

## 2017-07-21 ENCOUNTER — Ambulatory Visit: Admission: RE | Admit: 2017-07-21 | Payer: Medicare Other | Source: Ambulatory Visit | Admitting: Radiation Oncology

## 2017-07-21 ENCOUNTER — Other Ambulatory Visit: Payer: Self-pay

## 2017-07-21 DIAGNOSIS — C541 Malignant neoplasm of endometrium: Secondary | ICD-10-CM

## 2017-07-21 DIAGNOSIS — Z79899 Other long term (current) drug therapy: Secondary | ICD-10-CM | POA: Insufficient documentation

## 2017-07-21 DIAGNOSIS — C569 Malignant neoplasm of unspecified ovary: Secondary | ICD-10-CM | POA: Diagnosis not present

## 2017-07-21 DIAGNOSIS — Z7984 Long term (current) use of oral hypoglycemic drugs: Secondary | ICD-10-CM | POA: Diagnosis not present

## 2017-07-21 NOTE — Progress Notes (Signed)
  Radiation Oncology         (336) [phone] ________________________________  Name: Kimberly [name] MRN: [id number]  Date: 07/21/2017  DOB: 1950/12/25  CC: Marton Redwood, MD  Marton Redwood, MD  HDR BRACHYTHERAPY NOTE  DIAGNOSIS: Stage IB, grade 3 endometrioid adenocarcinoma of the endometrium, multifocal lymphovascular space invasion, isolated tumor cells within left external iliac lymph node chain.   Simple treatment device note: Patient had construction of her custom vaginal cylinder. She will be treated with a 3.0 cm diameter segmented cylinder. This conforms to her anatomy without undue discomfort.  Vaginal brachytherapy procedure node: The patient was brought to the [facility] suite. Identity was confirmed. All relevant records and images related to the planned course of therapy were reviewed. The patient freely provided informed written consent to proceed with treatment after reviewing the details related to the planned course of therapy. The consent form was witnessed and verified by the simulation staff. Then, the patient was set-up in a stable reproducible supine position for radiation therapy. Pelvic exam revealed the vaginal cuff to be intact. The patient's custom vaginal cylinder was placed in the proximal vagina. This was affixed to the CT/MR stabilization plate to prevent slippage. Patient tolerated the placement well.  Verification simulation note:  A fiducial marker was placed within the vaginal cylinder. An AP and lateral film was then obtained through the pelvis area. This documented accurate position of the vaginal cylinder for treatment.  HDR BRACHYTHERAPY TREATMENT  The remote afterloading device was affixed to the vaginal cylinder by catheter. Patient then proceeded to undergo her first high-dose-rate treatment directed at the proximal vagina. The patient was prescribed a dose of 6.0 gray to be delivered to the mucosal surface. Treatment length was 3.0 cm. Patient was treated with 1  channel using 7 dwell positions. Treatment time was 305.8 seconds. Iridium 192 was the high-dose-rate source for treatment. The patient tolerated the treatment well. After completion of her therapy, a radiation survey was performed documenting return of the iridium source into the [employer] safe.   PLAN: Patient will return next week for her second high-dose-rate treatment. ________________________________  Blair Promise, PhD, MD

## 2017-07-21 NOTE — Progress Notes (Signed)
  Radiation Oncology         [phone] ________________________________  Name: Kimberly [name] MRN: [id number]  Date: 07/21/2017  DOB: 19-Aug-1950  Vaginal Brachytherapy Procedure Note  CC: Marton Redwood, MD Marton Redwood, MD    ICD-10-CM   1. Endometrial cancer determined by uterine biopsy (HCC) C54.1     Diagnosis: Stage IB, grade 3 endometrioid adenocarcinoma of the endometrium, multifocal lymphovascular space invasion, isolated tumor cells within left external iliac lymph node chain    Narrative: She returns today for vaginal cylinder fitting. She recently completed her external beam treatments. She has had some diarrhea and abdominal gas and some fatigue but otherwise is doing well. She denies any vaginal bleeding or discharge. 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  ALLERGIES: is allergic to sulfur.  Meds: Current Outpatient Medications  Medication Sig Dispense Refill  . metFORMIN (GLUCOPHAGE) 1000 MG tablet Take 1,000 mg by mouth 2 (two) times daily with a meal.    . rosuvastatin (CRESTOR) 10 MG tablet Take 10 mg by mouth daily.    Marland Kitchen zolpidem (AMBIEN CR) 12.5 MG CR tablet Take 12.5 mg by mouth at bedtime as needed for sleep.    Marland Kitchen LORazepam (ATIVAN) 0.5 MG tablet Take 1 tablet (0.5 mg total) by mouth every 8 (eight) hours. (Patient not taking: Reported on 06/04/2017) 30 tablet 0   Current Facility-Administered Medications  Medication Dose Route Frequency Provider Last Rate Last Dose  . 0.9 %  sodium chloride infusion  500 mL Intravenous Continuous Milus Banister, MD        Physical Findings: The patient is in no acute distress. Patient is alert and oriented.  height is 5\' 2"  (1.575 m) and weight is 101 lb (45.8 kg). Her oral temperature is 98.2 F (36.8 C). Her blood pressure is 96/64 and her pulse is 92. Her oxygen saturation is 99%.   No palpable cervical, supraclavicular or axillary lymphoadenopathy. The heart  has a regular rate and rhythm. The lungs are clear to auscultation. Abdomen soft and non-tender.  On pelvic examination the external genitalia were unremarkable. A speculum exam was performed. Vaginal cuff intact, no mucosal lesions. On bimanual exam there were no pelvic masses appreciated.  Lab Findings: Lab Results  Component Value Date   WBC 7.0 05/27/2017   HCT 37.8 05/27/2017   MCV 93.6 05/27/2017   PLT 328 05/27/2017    Radiographic Findings: No results found.  Impression:  Stage IB, grade 3 endometrioid adenocarcinoma of the endometrium, multifocal lymphovascular space invasion, isolated tumor cells within left external iliac lymph node chain    Patient was fitted for a vaginal cylinder. The patient will be treated with a 3.0 cm diameter cylinder with a treatment length of 3.0 cm. This distended the vaginal vault without undue discomfort. The patient tolerated the procedure well.  The patient was successfully fitted for a vaginal cylinder. The patient is appropriate to begin vaginal brachytherapy.   Plan: The patient will proceed with CT simulation and vaginal brachytherapy today.    _______________________________   Blair Promise, PhD, MD

## 2017-07-21 NOTE — Progress Notes (Signed)
  Radiation Oncology         [phone] ________________________________  Name: Kimberly [name] MRN: [id number]  Date: 07/21/2017  DOB: 1951-04-10  SIMULATION AND TREATMENT PLANNING NOTE HDR BRACHYTHERAPY  DIAGNOSIS:  Stage IB, grade 3 endometrioid adenocarcinoma of the endometrium, multifocal lymphovascular space invasion, isolated tumor cells within left external iliac lymph node chain.    NARRATIVE:  The patient was brought to the [facility].  Identity was confirmed.  All relevant records and images related to the planned course of therapy were reviewed.  The patient freely provided informed written consent to proceed with treatment after reviewing the details related to the planned course of therapy. The consent form was witnessed and verified by the simulation staff.  Then, the patient was set-up in a stable reproducible  supine position for radiation therapy.  CT images were obtained.  Surface markings were placed.  The CT images were loaded into the planning software.  Then the target and avoidance structures were contoured.  Treatment planning then occurred.  The radiation prescription was entered and confirmed.   I have requested : Brachytherapy Isodose Plan and Dosimetry Calculations to plan the radiation distribution.    PLAN:  The patient will receive 18 Gy in 3 fractions. Patient will be treated with a 3 cm segmented cylinder with a treatment length of 3 cm. Prescription will be to the mucosal surface. Iridium 192 will be the high-dose-rate source.    ________________________________  Blair Promise, PhD, MD

## 2017-07-21 NOTE — Progress Notes (Signed)
Kimberly [name] is here for follow up/HDR.  She denies having any pain today.  She reports having gas and occasional diarrhea.  She is taking gas-x and imodium as needed.  She denies having any bladder issues or vaginal bleeding.  She reports feeling fatigued and said she has a poor appetite.  She had skin cancer removed from the left side of her upper lip on Thursday.  BP 96/64 (BP Location: Right Arm, Patient Position: Sitting)   Pulse 92   Temp 98.2 F (36.8 C) (Oral)   Ht 5\' 2"  (1.575 m)   Wt 101 lb (45.8 kg)   SpO2 99%   BMI 18.47 kg/m    Wt Readings from Last 3 Encounters:  07/21/17 101 lb (45.8 kg)  06/04/17 104 lb 6.4 oz (47.4 kg)  06/02/17 105 lb (47.6 kg)

## 2017-07-27 ENCOUNTER — Telehealth: Payer: Self-pay | Admitting: *Deleted

## 2017-07-27 NOTE — Telephone Encounter (Signed)
CALLED PATIENT TO REMIND OF HDR [place] 07-28-17 @ 1 PM, SPOKE WITH PATIENT AND HE IS AWARE OF THIS [place].

## 2017-07-28 ENCOUNTER — Telehealth: Payer: Self-pay

## 2017-07-28 ENCOUNTER — Ambulatory Visit
Admission: RE | Admit: 2017-07-28 | Discharge: 2017-07-28 | Disposition: A | Discharge status: Home / Self Care | Payer: Medicare Other | Source: Ambulatory Visit | Attending: Radiation Oncology | Admitting: Radiation Oncology

## 2017-07-28 DIAGNOSIS — C541 Malignant neoplasm of endometrium: Secondary | ICD-10-CM | POA: Diagnosis not present

## 2017-07-28 DIAGNOSIS — C569 Malignant neoplasm of unspecified ovary: Secondary | ICD-10-CM | POA: Diagnosis not present

## 2017-07-28 NOTE — Telephone Encounter (Signed)
Nutrition Assessment   Reason for Assessment:  Patient identified on Malnutrition Screening report for poor appetite and weight loss  ASSESSMENT:  67 year old female with endometeriod adenocarcinoma.  Patient receiving radiation therapy.  Past medical history of reviewed.    Spoke with patient via phone this am and introduced self and service.  Patient reports poor appetite.  Also is having diarrhea after starting radiation therapy but controlled with imodium.  Reports that she has never been a "big eater".  Reports that typically she has a smoothie made with whole milk, protein powder, peanut butter, fruit and wheat germ for breakfast or will have oatmeal with nuts or peanut butter and dried fruit.  Lunch is usually a sandwich and dinner is baked chicken and vegetables or bean soup.    Nutrition Focused Physical Exam: deferred  Medications: reviewed  Labs: reviewed  Anthropometrics:   Height: 62 inches Weight: 101 lb UBW: 105 lb per patient BMI: 18  4% weight loss in the last month   NUTRITION DIAGNOSIS: Inadequate oral intake related to cancer and cancer related treatment as evidenced by 4% weight loss   MALNUTRITION DIAGNOSIS: unable to determine at this time   INTERVENTION:   Discussed the importance of good nutrition during treatment. Discussed importance of high calorie high protein foods.   Contact information provided and encouraged patient to reach out to RD for nutrition appointment or to help with nutrition if needed during treatment.  Patient appreciative of call.    MONITORING, EVALUATION, GOAL: weight trends, intake   NEXT VISIT: as needed, patient to contact  Avice [place] B. Zenia Resides, [place], [place] Registered Dietitian [phone] (pager)

## 2017-07-28 NOTE — Progress Notes (Signed)
  Radiation Oncology         [phone] ________________________________  Name: Kimberly [name] MRN: [id number]  Date: 07/28/2017  DOB: [birth year]  CC: Marton Redwood, MD  Marton Redwood, MD  HDR BRACHYTHERAPY NOTE  DIAGNOSIS: : Stage IB, grade 3 endometrioid adenocarcinoma of the endometrium, multifocal lymphovascular space invasion, isolated tumor cells within left external iliac lymph node chain.     Simple treatment device note: Patient had construction of her custom vaginal cylinder. She will be treated with a 3.0 cm diameter segmented cylinder. This conforms to her anatomy without undue discomfort.  Vaginal brachytherapy procedure node: The patient was brought to the [facility] suite. Identity was confirmed. All relevant records and images related to the planned course of therapy were reviewed. The patient freely provided informed written consent to proceed with treatment after reviewing the details related to the planned course of therapy. The consent form was witnessed and verified by the simulation staff. Then, the patient was set-up in a stable reproducible supine position for radiation therapy. Pelvic exam revealed the vaginal cuff to be intact  . The patient's custom vaginal cylinder was placed in the proximal vagina. This was affixed to the CT/MR stabilization plate to prevent slippage. Patient tolerated the placement well.  Verification simulation note:  A fiducial marker was placed within the vaginal cylinder. An AP and lateral film was then obtained through the pelvis area. This documented accurate position of the vaginal cylinder for treatment.  HDR BRACHYTHERAPY TREATMENT  The remote afterloading device was affixed to the vaginal cylinder by catheter. Patient then proceeded to undergo her second high-dose-rate treatment directed at the proximal vagina. The patient was prescribed a dose of 6.0 gray to be delivered to the mucosal surface. Treatment length was 3.0 cm. Patient was treated  with 1 channel using 7 dwell positions. Treatment time was 326.0 seconds. Iridium 192 was the high-dose-rate source for treatment. The patient tolerated the treatment well. After completion of her therapy, a radiation survey was performed documenting return of the iridium source into the [employer] safe.   PLAN: she will return next week for her third and final high-dose rate treatment. ________________________________  Blair Promise, PhD, MD

## 2017-07-30 ENCOUNTER — Telehealth: Payer: Self-pay | Admitting: *Deleted

## 2017-07-30 NOTE — Telephone Encounter (Signed)
Called patient to remind of HDR Tx. For 08-03-17 @ 1 pm, lvm for a return call

## 2017-08-03 ENCOUNTER — Ambulatory Visit
Admission: RE | Admit: 2017-08-03 | Discharge: 2017-08-03 | Disposition: A | Discharge status: Home / Self Care | Payer: Medicare Other | Source: Ambulatory Visit | Attending: Radiation Oncology | Admitting: Radiation Oncology

## 2017-08-03 ENCOUNTER — Encounter: Payer: Self-pay | Admitting: Radiation Oncology

## 2017-08-03 DIAGNOSIS — C541 Malignant neoplasm of endometrium: Secondary | ICD-10-CM | POA: Diagnosis not present

## 2017-08-03 DIAGNOSIS — C569 Malignant neoplasm of unspecified ovary: Secondary | ICD-10-CM | POA: Diagnosis not present

## 2017-08-03 NOTE — Progress Notes (Signed)
  Radiation Oncology         [phone] ________________________________  Name: Kimberly [name] MRN: [id number]  Date: 08/03/2017  DOB: 1950/09/07  End of Treatment Note  Diagnosis: Stage IB, grade 3 endometrioid adenocarcinoma of the endometrium, multifocal lymphovascular space invasion, isolated tumor cells within left external iliac lymph node chain    Indication for treatment: Curative      Radiation treatment dates: 06/11/17-07/15/17, external beam;  07/21/17-08/03/17 brachytherapy  Site/dose: 1) pelvis 45 Gy in 25 fractions 2) Vagina/ 18 Gy in 3 fractions  Beams/energy: 1) IMRT/ 6X 2) HDR Ir-192 Vaginal/ Iridium-192, the patient was treated with a 3 cm diameter cylinder with the treatment length of 3 cm  Narrative: The patient tolerated radiation treatment relatively well. During treatment the patient reported improving constipation. She complained of gas pains and urinary frequency. She denied dysuria, or vaginal bleeding. Some fatigue.  Plan: The patient has completed radiation treatment. The patient will return to radiation oncology clinic for routine followup in one month. I advised them to call or return sooner if they have any questions or concerns related to their recovery or treatment.  -----------------------------------  Blair Promise, PhD, MD  This document serves as a record of services personally performed by Gery Pray, MD. It was created on his behalf by Bethann Humble, a trained medical scribe. The creation of this record is based on the scribe's personal observations and the provider's statements to them. This document has been checked and approved by the attending provider.

## 2017-08-03 NOTE — Progress Notes (Signed)
  Radiation Oncology         [phone] ________________________________  Name: Kimberly [name] MRN: [id number]  Date: 08/03/2017  DOB: [birth year]  CC: Marton Redwood, MD  Marton Redwood, MD  HDR BRACHYTHERAPY NOTE  DIAGNOSIS: : Stage IB, grade 3 endometrioid adenocarcinoma of the endometrium, multifocal lymphovascular space invasion, isolated tumor cells within left external iliac lymph node chain.  Simple treatment device note: Patient had construction of her custom vaginal cylinder. She will be treated with a 3.0 cm diameter segmented cylinder. This conforms to her anatomy without undue discomfort.  Vaginal brachytherapy procedure node: The patient was brought to the [facility] suite. Identity was confirmed. All relevant records and images related to the planned course of therapy were reviewed. The patient freely provided informed written consent to proceed with treatment after reviewing the details related to the planned course of therapy. The consent form was witnessed and verified by the simulation staff. Then, the patient was set-up in a stable reproducible supine position for radiation therapy. Pelvic exam revealed the vaginal cuff to be intact  . The patient's custom vaginal cylinder was placed in the proximal vagina. This was affixed to the CT/MR stabilization plate to prevent slippage. Patient tolerated the placement well.  Verification simulation note:  A fiducial marker was placed within the vaginal cylinder. An AP and lateral film was then obtained through the pelvis area. This documented accurate position of the vaginal cylinder for treatment.  HDR BRACHYTHERAPY TREATMENT  The remote afterloading device was affixed to the vaginal cylinder by catheter. Patient then proceeded to undergo her third high-dose-rate treatment directed at the proximal vagina. The patient was prescribed a dose of 6.0 gray to be delivered to the mucosal surface. Treatment length was 3.0 cm. Patient was treated with  1 channel using 7 dwell positions. Treatment time was 345.7 seconds. Iridium 192 was the high-dose-rate source for treatment. The patient tolerated the treatment well. After completion of her therapy, a radiation survey was performed documenting return of the iridium source into the [employer] safe.   PLAN: The patient will return in 1 month for follow-up. ________________________________  Blair Promise, PhD, MD   This document serves as a record of services personally performed by Gery Pray, MD. It was created on his behalf by Bethann Humble, a trained medical scribe. The creation of this record is based on the scribe's personal observations and the provider's statements to them. This document has been checked and approved by the attending provider.

## 2017-08-24 ENCOUNTER — Encounter: Payer: Self-pay | Admitting: Radiation Oncology

## 2017-09-01 ENCOUNTER — Telehealth: Payer: Self-pay | Admitting: Hematology

## 2017-09-03 ENCOUNTER — Ambulatory Visit
Admission: RE | Admit: 2017-09-03 | Discharge: 2017-09-03 | Disposition: A | Discharge status: Home / Self Care | Payer: Medicare Other | Source: Ambulatory Visit | Attending: Radiation Oncology | Admitting: Radiation Oncology

## 2017-09-03 ENCOUNTER — Other Ambulatory Visit: Payer: Self-pay

## 2017-09-03 ENCOUNTER — Encounter: Payer: Self-pay | Admitting: Radiation Oncology

## 2017-09-03 VITALS — BP 125/74 | HR 95 | Temp 98.6°F | Resp 18 | Wt 97.5 lb

## 2017-09-03 DIAGNOSIS — C541 Malignant neoplasm of endometrium: Secondary | ICD-10-CM | POA: Diagnosis not present

## 2017-09-03 DIAGNOSIS — D4989 Neoplasm of unspecified behavior of other specified sites: Secondary | ICD-10-CM | POA: Diagnosis not present

## 2017-09-03 DIAGNOSIS — Z08 Encounter for follow-up examination after completed treatment for malignant neoplasm: Secondary | ICD-10-CM | POA: Diagnosis not present

## 2017-09-03 NOTE — Progress Notes (Signed)
  Radiation Oncology         (336) [phone] ________________________________  Name: Kimberly [name] MRN: [id number]  Date: 09/03/2017  DOB: [birth year]  Follow-Up Visit Note  CC: Marton Redwood, MD  Nicholas Lose, MD    ICD-10-CM   1. 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 Pt states that she has problems with her appetite but reports that she never really had a big appetite. Pt reports that she exercise regularly. Pt is doing well overall. Pt denies nausea.Pt reports that she does not ever use the Ativan prescription             ALLERGIES:  is allergic to sulfur.  Meds: Current Outpatient Medications  Medication Sig Dispense Refill  . metFORMIN (GLUCOPHAGE) 1000 MG tablet Take 1,000 mg by mouth 2 (two) times daily with a meal.    . rosuvastatin (CRESTOR) 10 MG tablet Take 10 mg by mouth daily.    Marland Kitchen zolpidem (AMBIEN CR) 12.5 MG CR tablet Take 12.5 mg by mouth at bedtime as needed for sleep.    Marland Kitchen LORazepam (ATIVAN) 0.5 MG tablet Take 1 tablet (0.5 mg total) by mouth every 8 (eight) hours. (Patient not taking: Reported on 06/04/2017) 30 tablet 0   Current Facility-Administered Medications  Medication Dose Route Frequency Provider Last Rate Last Dose  . 0.9 %  sodium chloride infusion  500 mL Intravenous Continuous Milus Banister, MD        Physical Findings: The patient is in no acute distress. Patient is alert and oriented.  weight is 97 lb 8 oz (44.2 kg). Her oral temperature is 98.6 F (37 C). Her blood pressure is 125/74 and her pulse is 95. Her respiration is 18 and oxygen saturation is 99%. . . Lungs are clear to auscultation bilaterally. Heart has regular rate and  rhythm. No palpable cervical, supraclavicular, or axillary adenopathy. Abdomen soft, non-tender, normal bowel sounds. No significant changes. Plevic exam not performed in light of recent completion of treatment.   Lab Findings: Lab Results  Component Value Date   WBC 7.0 05/27/2017   HGB 12.5 05/27/2017   HCT 37.8 05/27/2017   MCV 93.6 05/27/2017   PLT 328 05/27/2017    Radiographic Findings: No results found.  Impression:    Stage IB, grade 3 endometrioid adenocarcinoma of the endometrium, multifocal lymphovascular space invasion, isolated tumor cells within left external iliac lymph node chain.  Patient is doing well after completion of external beam and vaginal brachytherapy.  Plan:  Pt will follow up with Dr.Rossi next month. Follow up in August with radiation oncology. Today the patient was given a vaginal dilator and instructions on its use.  ____________________________________   Blair Promise, PhD, MD This document serves as a record of services personally performed by Gery Pray, MD. It was created on his behalf by Valeta Harms, a trained medical scribe. The creation of this record is based on the scribe's personal observations and the provider's statements to them. This document has been checked and approved by the attending provider.

## 2017-09-03 NOTE — Progress Notes (Signed)

## 2017-09-03 NOTE — Progress Notes (Addendum)
Kimberly [name] is here for a following -up appointment. Denies any pain or fatigue. Denies any vaginal or rectal bleeding. Denies any vaginal discharge. Denies any issues with her bowels. Reports nocturia x1. Denies any burning with urination. Denies any skin issues. Denies any nausea or vomiting.Patient was given a dilator s and m with instruction.Verbalized understanding.  Vitals:   09/03/17 1056  BP: 125/74  Pulse: 95  Resp: 18  Temp: 98.6 F (37 C)  TempSrc: Oral  SpO2: 99%  Weight: 97 lb 8 oz (44.2 kg)   Wt Readings from Last 3 Encounters:  09/03/17 97 lb 8 oz (44.2 kg)  07/21/17 101 lb (45.8 kg)  06/04/17 104 lb 6.4 oz (47.4 kg)

## 2017-09-07 NOTE — Addendum Note (Signed)
Encounter addended by: Zola Button, RN on: 09/07/2017 8:02 AM  Actions taken: Order Reconciliation Section accessed, Order list changed

## 2017-09-11 ENCOUNTER — Inpatient Hospital Stay: Payer: Medicare Other | Attending: Gynecologic Oncology | Admitting: Gynecologic Oncology

## 2017-09-11 ENCOUNTER — Encounter: Payer: Self-pay | Admitting: Gynecologic Oncology

## 2017-09-11 VITALS — BP 120/67 | HR 81 | Temp 97.9°F | Resp 16 | Ht 62.0 in | Wt 99.0 lb

## 2017-09-11 DIAGNOSIS — Z90722 Acquired absence of ovaries, bilateral: Secondary | ICD-10-CM

## 2017-09-11 DIAGNOSIS — N959 Unspecified menopausal and perimenopausal disorder: Secondary | ICD-10-CM | POA: Insufficient documentation

## 2017-09-11 DIAGNOSIS — Z853 Personal history of malignant neoplasm of breast: Secondary | ICD-10-CM

## 2017-09-11 DIAGNOSIS — Z923 Personal history of irradiation: Secondary | ICD-10-CM

## 2017-09-11 DIAGNOSIS — Z8041 Family history of malignant neoplasm of ovary: Secondary | ICD-10-CM | POA: Diagnosis not present

## 2017-09-11 DIAGNOSIS — Z8542 Personal history of malignant neoplasm of other parts of uterus: Secondary | ICD-10-CM | POA: Diagnosis not present

## 2017-09-11 DIAGNOSIS — Z90710 Acquired absence of both cervix and uterus: Secondary | ICD-10-CM

## 2017-09-11 DIAGNOSIS — C541 Malignant neoplasm of endometrium: Secondary | ICD-10-CM

## 2017-09-11 NOTE — Progress Notes (Signed)
Consult Note: Gyn-Onc  Consult was requested by Dr. Benjie Karvonen for the evaluation of Kimberly [name] 67 y.o. female  CC:  Chief Complaint  Patient presents with  . Endometrial cancer determined by uterine biopsy [facility])    Assessment/Plan:  Kimberly [name]  is a 67 y.o.  year old with stage IB grade 3 endometrioid endometrial cancer (MSI low/stable). Surgery December, 2018. S/p adjuvant external beam and brachytherapy completed February, 2019. No evidence of disease recurrence. I recommend continuing 3 monthly evaluations. I will see her in 6 months, Dr Sondra Come will see her in 3 months. Counseled regarding symtpoms of recurrence. Recommended diet to avoid diarrhea.  HPI: Kimberly [name] is a 67 year old P2 who is seen in consultation at the request of Dr Benjie Karvonen for grade 2 endometrial cancer.  The patient has a history of postmenopausal spotting since April, 2018. She was seen by a PA in her PCP's office in July, 2018 and a pap test was performed which per patient was negative and therefore no intervention followed. The patient continued to have spotting and informed her PCP, Dr Brigitte Pulse, of this in October during her annual physical. He then promptly referred her to Dr [facility] office 02/27/17 and a TVUS was performed which showed a uterus measuring normal dimensions and a 46m endometrial stripe.  On 02/28/17 an office pipelle biopsy was performed and revealed a FIGO grade 2 endometrioid endometrial cancer.  The patient has a history of a prior USO (? Right) many years ago for a benign cyst on the ovary. She has also had an open appendectomy remotely. She is thin (103lbs) and otherwise very healthy.  She has had L5 surgery through posterior approach.  Her sister has a history of breast and ovarian cancer, and while she was not tested, Kimberly [name] underwent genetic testing for BRCA and MLH/MSH abnormalities which were unremarkable.    Interval Hx:  On 04/13/17 she underwent robotic assisted total  hysterectomy BSO, [facility] revealed a deeply invasive grade 3 endometrioid tumor with multifocal LVSI present and ITCs present in a left external iliac SLN. Assiged stage IB grade 3. MSI low (stable). Due to these high risk features, she was determined to be at high risk for recurrence and adjuvant external beam radiation with vaginal brachytherapy were recommended.   She went on to receive external beam and vaginal brachytherapy completed in February, 2019. She tolerated therapy well. She has some intermittent diarrhea and left genitofemoral nerve numbness.   Current Meds:  Outpatient Encounter Medications as of 09/11/2017  Medication Sig  . metFORMIN (GLUCOPHAGE) 1000 MG tablet Take 1,000 mg by mouth 2 (two) times daily with a meal.  . rosuvastatin (CRESTOR) 10 MG tablet Take 10 mg by mouth daily.  .Marland Kitchenzolpidem (AMBIEN CR) 12.5 MG CR tablet Take 12.5 mg by mouth at bedtime as needed for sleep.   Facility-Administered Encounter Medications as of 09/11/2017  Medication  . 0.9 %  sodium chloride infusion    Allergy:  Allergies  Allergen Reactions  . Sulfur Hives    Social Hx:   Social History   Socioeconomic History  . Marital status: Divorced    Spouse name: Not on file  . Number of children: Not on file  . Years of education: Not on file  . Highest education level: Not on file  Occupational History  . Not on file  Social Needs  . Financial resource strain: Not on file  . Food  insecurity:    Worry: Not on file    Inability: Not on file  . Transportation needs:    Medical: Not on file    Non-medical: Not on file  Tobacco Use  . Smoking status: Never Smoker  . Smokeless tobacco: Never Used  Substance and Sexual Activity  . Alcohol use: Yes    Alcohol/week: 4.2 oz    Types: 7 Glasses of wine per week    Comment: 1 glass red wine each night with dinner  . Drug use: No  . Sexual activity: Not on file  Lifestyle  . Physical  activity:    Days per week: Not on file    Minutes per session: Not on file  . Stress: Not on file  Relationships  . Social connections:    Talks on phone: Not on file    Gets together: Not on file    Attends religious service: Not on file    Active member of club or organization: Not on file    Attends meetings of clubs or organizations: Not on file    Relationship status: Not on file  . Intimate partner violence:    Fear of current or ex partner: Not on file    Emotionally abused: Not on file    Physically abused: Not on file    Forced sexual activity: Not on file  Other Topics Concern  . Not on file  Social History Narrative  . Not on file    Past Surgical Hx:  Past Surgical History:  Procedure Laterality Date  . APPENDECTOMY    . BACK SURGERY    . CARDIAC SURGERY    . COLONOSCOPY    . OVARY REMOVE    . ROBOTIC ASSISTED TOTAL HYSTERECTOMY WITH SALPINGECTOMY  04/13/2017   total robotic hysterectomy, left salpingo-oophorectomy, sentinel lymph node identification and biopsy by Dr. Clarene Essex at [facility].  . SVT ABLATION  2013    Past Medical Hx:  Past Medical History:  Diagnosis Date  . Cataract   . Diabetes mellitus without complication ([place])   . Endometrial cancer [facility])     Past Gynecological History:  SVD x 2 No LMP recorded. Patient has had a hysterectomy.  Family Hx:  Family History  Problem Relation Age of Onset  . Diabetes Mother   . Heart disease Father   . Breast cancer Sister   . Colon cancer Neg Hx     Review of Systems:  Constitutional  Feels well,    ENT Normal appearing ears and nares bilaterally Skin/Breast  No rash, sores, jaundice, itching, dryness Cardiovascular  No chest pain, shortness of breath, or edema  Pulmonary  No cough or wheeze.  Gastro Intestinal  No nausea, vomitting, or diarrhoea. No bright red blood per rectum, no abdominal pain, change in bowel movement, or constipation.  Genito Urinary  No frequency, urgency, dysuria, no  postmenopausal bleeding Musculo Skeletal  No myalgia, arthralgia, joint swelling or pain  Neurologic  No weakness, numbness, change in gait,  Psychology  No depression, anxiety, insomnia.   Vitals:  Blood pressure 120/67, pulse 81, temperature 97.9 F (36.6 C), resp. rate 16, height _0  (1.575 m), weight 99 lb (44.9 kg), SpO2 100 %.  Physical Exam: WD in NAD Neck  Supple NROM, without any enlargements.  Lymph Node Survey No cervical supraclavicular or inguinal adenopathy Cardiovascular  Pulse normal rate, regularity and rhythm. S1 and S2 normal.  Lungs  Clear to auscultation bilateraly, without wheezes/crackles/rhonchi. Good air movement.  Skin  No rash/lesions/breakdown  Psychiatry  Alert and oriented to person, place, and time  Abdomen  Normoactive bowel sounds, abdomen soft, non-tender and thin without evidence of hernia. No masses.  Back No CVA tenderness Genito Urinary  Normal external genitalia. Normal vagina. Normal cuff. No masses or lesions. Rectal  deferred Extremities  No bilateral cyanosis, clubbing or edema.   Thereasa Solo, MD  09/11/2017, 2:28 PM

## 2017-09-11 NOTE — Patient Instructions (Signed)
Follow up with Dr. Denman George in November as scheduled.

## 2017-10-26 DIAGNOSIS — E7849 Other hyperlipidemia: Secondary | ICD-10-CM | POA: Diagnosis not present

## 2017-10-26 DIAGNOSIS — Z681 Body mass index (BMI) 19 or less, adult: Secondary | ICD-10-CM | POA: Diagnosis not present

## 2017-10-26 DIAGNOSIS — E119 Type 2 diabetes mellitus without complications: Secondary | ICD-10-CM | POA: Diagnosis not present

## 2017-10-26 DIAGNOSIS — M8580 Other specified disorders of bone density and structure, unspecified site: Secondary | ICD-10-CM | POA: Diagnosis not present

## 2017-10-26 DIAGNOSIS — C541 Malignant neoplasm of endometrium: Secondary | ICD-10-CM | POA: Diagnosis not present

## 2017-12-08 DIAGNOSIS — R300 Dysuria: Secondary | ICD-10-CM | POA: Diagnosis not present

## 2017-12-08 DIAGNOSIS — N390 Urinary tract infection, site not specified: Secondary | ICD-10-CM | POA: Diagnosis not present

## 2018-01-07 ENCOUNTER — Other Ambulatory Visit: Payer: Self-pay

## 2018-01-07 ENCOUNTER — Encounter: Payer: Self-pay | Admitting: Radiation Oncology

## 2018-01-07 ENCOUNTER — Ambulatory Visit
Admission: RE | Admit: 2018-01-07 | Discharge: 2018-01-07 | Disposition: A | Discharge status: Home / Self Care | Payer: Medicare Other | Source: Ambulatory Visit | Attending: Radiation Oncology | Admitting: Radiation Oncology

## 2018-01-07 VITALS — BP 118/49 | HR 83 | Temp 98.1°F | Resp 18 | Ht 62.0 in | Wt 98.2 lb

## 2018-01-07 DIAGNOSIS — Z08 Encounter for follow-up examination after completed treatment for malignant neoplasm: Secondary | ICD-10-CM | POA: Diagnosis not present

## 2018-01-07 DIAGNOSIS — Z79899 Other long term (current) drug therapy: Secondary | ICD-10-CM | POA: Insufficient documentation

## 2018-01-07 DIAGNOSIS — C541 Malignant neoplasm of endometrium: Secondary | ICD-10-CM

## 2018-01-07 DIAGNOSIS — Z923 Personal history of irradiation: Secondary | ICD-10-CM | POA: Diagnosis not present

## 2018-01-07 DIAGNOSIS — Z8542 Personal history of malignant neoplasm of other parts of uterus: Secondary | ICD-10-CM | POA: Diagnosis not present

## 2018-01-07 DIAGNOSIS — Z7984 Long term (current) use of oral hypoglycemic drugs: Secondary | ICD-10-CM | POA: Diagnosis not present

## 2018-01-07 NOTE — Progress Notes (Signed)
Radiation Oncology         (336) [phone] ________________________________  Name: Kimberly [name] MRN: [id number]  Date: 01/07/2018  DOB: [birth year]  Follow-Up Visit Note  CC: Marton Redwood, MD  Marton Redwood, MD    ICD-10-CM   1. Endometrial cancer determined by uterine biopsy (HCC) C54.1     Diagnosis: Stage IB, grade 3 endometrioid adenocarcinoma of the endometrium, multifocal lymphovascular space invasion, isolated tumor cells within left external iliac lymph node chain      Interval Since Last Radiation:  5 months  07/21/17 - 08/03/17 45 Gy: HDR Brachytherapy/ 18 Gy in 3 fractions  Narrative:  The patient returns today for routine follow-up. She last saw Dr. Denman George 09/11/17. Patient denies diarrhea, vaginal discharge or bleeding, changes in weight, pain, fatigue, nausea or vomiting, and urinary bladder changes. She notes she is not using the vaginal dilator.       ALLERGIES:  is allergic to sulfur.  Meds: Current Outpatient Medications  Medication Sig Dispense Refill  . metFORMIN (GLUCOPHAGE) 1000 MG tablet Take 1,000 mg by mouth 2 (two) times daily with a meal.    . rosuvastatin (CRESTOR) 10 MG tablet Take 10 mg by mouth daily.    Marland Kitchen zolpidem (AMBIEN CR) 12.5 MG CR tablet Take 12.5 mg by mouth at bedtime as needed for sleep.     Current Facility-Administered Medications  Medication Dose Route Frequency Provider Last Rate Last Dose  . 0.9 %  sodium chloride infusion  500 mL Intravenous Continuous Milus Banister, MD        Physical Findings: The patient is in no acute distress. Patient is alert and oriented.  height is 5\' 2"  (1.575 m) and weight is 98 lb 3.2 oz (44.5 kg). Her oral temperature is 98.1 F (36.7 C). Her blood pressure is 118/49 (abnormal) and her pulse is 83. Her respiration is 18 and oxygen saturation is 100%.  Lungs are clear to auscultation bilaterally. Heart has regular rate and rhythm. No palpable cervical, supraclavicular, or axillary adenopathy. Abdomen soft,  non-tender, normal bowel sounds. No significant changes.   On pelvic examination the external genitalia were unremarkable. A speculum exam was performed. There are no mucosal lesions noted in the vaginal vault. On bimanual and rectovaginal examination there were no pelvic masses appreciated. Patient was noted to have some agglutination in the mid vagina which were easily broken with digital exam. Patient was noted to have radiation changes to the cuff, but no lesions or signs of recurrence.   Lab Findings: Lab Results  Component Value Date   WBC 7.0 05/27/2017   HGB 12.5 05/27/2017   HCT 37.8 05/27/2017   MCV 93.6 05/27/2017   PLT 328 05/27/2017    Radiographic Findings: No results found.  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   No evidence of recurrence on clinical exam.  Plan:  She is scheduled to follow up with Dr. Denman George on 03/15/18, and will return for follow up in radiation oncology in February 2020. The patient will discuss with Dr. Denman George at that followup whether CT scanning will be performed  ____________________________________   Blair Promise, PhD, MD  This document serves as a record of services personally performed by Gery Pray, MD. It was created on his behalf by Wilburn Mylar, a trained medical scribe. The creation of this record is based on the scribe's personal observations and the provider's  statements to them. This document has been checked and approved by the attending provider.  

## 2018-01-07 NOTE — Progress Notes (Signed)
Pt here today for a follow-up for endometrial cancer. Pt denies having pain or fatigue. Pt denies having nausea or vomiting. Pt denies having diarrhea. Pt denies having any vaginal discharge or rectal bleeding. Pt denies having any urinary bladder changes. Pt is not using the vaginal dilator.  BP (!) 118/49 (BP Location: Right Arm)   Pulse 83   Temp 98.1 F (36.7 C) (Oral)   Resp 18   Ht 5\' 2"  (1.575 m)   Wt 98 lb 3.2 oz (44.5 kg)   SpO2 100%   BMI 17.96 kg/m    Wt Readings from Last 3 Encounters:  01/07/18 98 lb 3.2 oz (44.5 kg)  09/11/17 99 lb (44.9 kg)  09/03/17 97 lb 8 oz (44.2 kg)

## 2018-01-26 DIAGNOSIS — E7849 Other hyperlipidemia: Secondary | ICD-10-CM | POA: Diagnosis not present

## 2018-01-26 DIAGNOSIS — M859 Disorder of bone density and structure, unspecified: Secondary | ICD-10-CM | POA: Diagnosis not present

## 2018-01-26 DIAGNOSIS — E119 Type 2 diabetes mellitus without complications: Secondary | ICD-10-CM | POA: Diagnosis not present

## 2018-01-26 DIAGNOSIS — R82998 Other abnormal findings in urine: Secondary | ICD-10-CM | POA: Diagnosis not present

## 2018-02-02 DIAGNOSIS — E119 Type 2 diabetes mellitus without complications: Secondary | ICD-10-CM | POA: Diagnosis not present

## 2018-02-02 DIAGNOSIS — C541 Malignant neoplasm of endometrium: Secondary | ICD-10-CM | POA: Diagnosis not present

## 2018-02-02 DIAGNOSIS — M859 Disorder of bone density and structure, unspecified: Secondary | ICD-10-CM | POA: Diagnosis not present

## 2018-02-02 DIAGNOSIS — E7849 Other hyperlipidemia: Secondary | ICD-10-CM | POA: Diagnosis not present

## 2018-02-02 DIAGNOSIS — G4709 Other insomnia: Secondary | ICD-10-CM | POA: Diagnosis not present

## 2018-02-02 DIAGNOSIS — Z1389 Encounter for screening for other disorder: Secondary | ICD-10-CM | POA: Diagnosis not present

## 2018-02-02 DIAGNOSIS — Z23 Encounter for immunization: Secondary | ICD-10-CM | POA: Diagnosis not present

## 2018-02-02 DIAGNOSIS — Z0000 Encounter for general adult medical examination without abnormal findings: Secondary | ICD-10-CM | POA: Diagnosis not present

## 2018-02-02 DIAGNOSIS — I498 Other specified cardiac arrhythmias: Secondary | ICD-10-CM | POA: Diagnosis not present

## 2018-02-02 DIAGNOSIS — Z681 Body mass index (BMI) 19 or less, adult: Secondary | ICD-10-CM | POA: Diagnosis not present

## 2018-02-08 ENCOUNTER — Ambulatory Visit (INDEPENDENT_AMBULATORY_CARE_PROVIDER_SITE_OTHER): Payer: Medicare Other | Admitting: Podiatry

## 2018-02-08 ENCOUNTER — Encounter: Payer: Self-pay | Admitting: Podiatry

## 2018-02-08 VITALS — BP 129/64 | HR 71

## 2018-02-08 DIAGNOSIS — L600 Ingrowing nail: Secondary | ICD-10-CM

## 2018-02-08 MED ORDER — NEOMYCIN-POLYMYXIN-HC 3.5-10000-1 OT SOLN: OTIC | 1 refills | Status: DC

## 2018-02-08 NOTE — Progress Notes (Signed)
Subjective:   Patient ID: Kimberly [name], female   DOB: 67 y.o.   MRN: [id number]   HPI Patient presents with ingrown toenail left over right stating that is been hurting her for a while and making shoe gear difficult.  Patient's tried to trim it herself without relief and states the right one also bothers her but not to the same degree and patient does not smoke and likes to be active.  States is been hurting her for several months   Review of Systems  All other systems reviewed and are negative.       Objective:  Physical Exam  Constitutional: She appears well-developed and well-nourished.  Cardiovascular: Intact distal pulses.  Pulmonary/Chest: Effort normal.  Musculoskeletal: Normal range of motion.  Neurological: She is alert.  Skin: Skin is warm.  Nursing note and vitals reviewed.   Neurovascular status intact muscle strength is adequate range of motion within normal limits with patient found to have incurvated left hallux nail medial lateral borders with pain and also deformity in the right hallux not to the same degree.  Patient's found to have good digital perfusion is well oriented x3 with no redness or drainage in the toe     Assessment:  Chronic ingrown toenail deformity left over right with incurvated nail borders     Plan:  H&P conditions reviewed and recommended removal of the nail borders.  I explained procedure and risk and patient wants surgery signed consent form.  I infiltrated 60 mg like Marcaine mixture sterile prep applied and using sterile instrumentation I removed the medial lateral borders exposed matrix and applied phenol 3 applications 30 seconds followed by alcohol lavage and sterile dressing.  Gave instructions on soaks and to take the dressing off of any throbbing were to occur and if not leave on for 24 hours.  Patient is encouraged to call with any questions concerns

## 2018-02-08 NOTE — Patient Instructions (Signed)

## 2018-02-23 ENCOUNTER — Telehealth: Payer: Self-pay | Admitting: *Deleted

## 2018-02-23 NOTE — Telephone Encounter (Signed)
On 02-23-18 call pt to r/s appt to 07-01-18 at 9:30, Cancel appt for 07-08-2018 Dr. Sondra Come out of the office

## 2018-03-04 ENCOUNTER — Ambulatory Visit (INDEPENDENT_AMBULATORY_CARE_PROVIDER_SITE_OTHER): Payer: Medicare Other | Admitting: Podiatry

## 2018-03-04 ENCOUNTER — Encounter: Payer: Self-pay | Admitting: Podiatry

## 2018-03-04 DIAGNOSIS — B351 Tinea unguium: Secondary | ICD-10-CM

## 2018-03-04 DIAGNOSIS — M79675 Pain in left toe(s): Secondary | ICD-10-CM | POA: Diagnosis not present

## 2018-03-04 DIAGNOSIS — M79674 Pain in right toe(s): Secondary | ICD-10-CM

## 2018-03-09 DIAGNOSIS — H5202 Hypermetropia, left eye: Secondary | ICD-10-CM | POA: Diagnosis not present

## 2018-03-09 DIAGNOSIS — H40053 Ocular hypertension, bilateral: Secondary | ICD-10-CM | POA: Diagnosis not present

## 2018-03-09 DIAGNOSIS — H40013 Open angle with borderline findings, low risk, bilateral: Secondary | ICD-10-CM | POA: Diagnosis not present

## 2018-03-09 DIAGNOSIS — H25813 Combined forms of age-related cataract, bilateral: Secondary | ICD-10-CM | POA: Diagnosis not present

## 2018-03-15 ENCOUNTER — Inpatient Hospital Stay: Payer: Medicare Other | Attending: Gynecologic Oncology | Admitting: Gynecologic Oncology

## 2018-03-15 ENCOUNTER — Encounter: Payer: Self-pay | Admitting: Gynecologic Oncology

## 2018-03-15 VITALS — BP 130/80 | HR 73 | Temp 98.3°F | Resp 18 | Ht 62.0 in | Wt 96.8 lb

## 2018-03-15 DIAGNOSIS — Z90722 Acquired absence of ovaries, bilateral: Secondary | ICD-10-CM

## 2018-03-15 DIAGNOSIS — C541 Malignant neoplasm of endometrium: Secondary | ICD-10-CM | POA: Diagnosis not present

## 2018-03-15 DIAGNOSIS — Z90710 Acquired absence of both cervix and uterus: Secondary | ICD-10-CM

## 2018-03-15 DIAGNOSIS — Z923 Personal history of irradiation: Secondary | ICD-10-CM | POA: Diagnosis not present

## 2018-03-15 DIAGNOSIS — Z1239 Encounter for other screening for malignant neoplasm of breast: Secondary | ICD-10-CM

## 2018-03-15 DIAGNOSIS — Z1231 Encounter for screening mammogram for malignant neoplasm of breast: Secondary | ICD-10-CM

## 2018-03-15 NOTE — Progress Notes (Signed)
Follow-up Note: Gyn-Onc  Consult was requested by Dr. Benjie Karvonen for the evaluation of Kimberly [name] 67 y.o. [name]  CC:  Chief Complaint  Patient presents with  . endometrial cancer    Assessment/Plan:  Kimberly [name]  is a 67 y.o.  year old with stage IB grade 3 endometrioid endometrial cancer (MSI low/stable). Surgery December, 2018. S/p adjuvant external beam and bra chytherapy completed March, 2019. No evidence of disease recurrence. I recommend continuing 3 monthly evaluations. I will see her in 6 months, Dr Sondra Come will see her in 3 months. Counseled regarding symtpoms of recurrence.  HPI: Kimberly [name] is a 67 year old P2 who is seen in consultation at the request of Dr Benjie Karvonen for grade 2 endometrial cancer.  The patient has a history of postmenopausal spotting since April, 2018. She was seen by a PA in her PCP's office in July, 2018 and a pap test was performed which per patient was negative and therefore no intervention followed. The patient continued to have spotting and informed her PCP, Dr Brigitte Pulse, of this in October during her annual physical. He then promptly referred her to Dr [facility] office 02/27/17 and a TVUS was performed which showed a uterus measuring normal dimensions and a 25m endometrial stripe.  On 02/28/17 an office pipelle biopsy was performed and revealed a FIGO grade 2 endometrioid endometrial cancer.  The patient has a history of a prior USO (? Right) many years ago for a benign cyst on the ovary. She has also had an open appendectomy remotely. She is thin (103lbs) and otherwise very healthy.  She has had L5 surgery through posterior approach.  Her sister has a history of breast and ovarian cancer, and while she was not tested, Ms Amsler underwent genetic testing for BRCA and MLH/MSH abnormalities which were unremarkable.    Interval Hx:  On 04/13/17 she underwent robotic assisted total hysterectomy BSO, SLN biopsy with Dr JCindie Larocheat [facility] Final pathology  revealed a deeply invasive grade 3 endometrioid tumor with multifocal LVSI present and ITCs present in a left external iliac SLN. Assiged stage IB grade 3. MSI low (stable). Due to these high risk features, she was determined to be at high risk for recurrence and adjuvant external beam radiation with vaginal brachytherapy were recommended.   She went on to receive external beam and vaginal brachytherapy completed in February, 2019. She tolerated therapy well.  She is doing well without any complaints.   Current Meds:  Outpatient Encounter Medications as of 03/15/2018  Medication Sig  . metFORMIN (GLUCOPHAGE) 1000 MG tablet Take 1,000 mg by mouth 2 (two) times daily with a meal.  . neomycin-polymyxin-hydrocortisone (CORTISPORIN) OTIC solution Apply 1-2 drops to toe after soaking BID  . rosuvastatin (CRESTOR) 10 MG tablet Take 10 mg by mouth daily.  .Marland Kitchenzolpidem (AMBIEN CR) 12.5 MG CR tablet Take 12.5 mg by mouth at bedtime as needed for sleep.   Facility-Administered Encounter Medications as of 03/15/2018  Medication  . 0.9 %  sodium chloride infusion    Allergy:  Allergies  Allergen Reactions  . Sulfur Hives    Social Hx:   Social History   Socioeconomic History  . Marital status: Divorced    Spouse name: Not on file  . Number of children: Not on file  . Years of education: Not on file  . Highest education level: Not on file  Occupational History  . Not on file  Social Needs  . Financial resource strain: Not on file  .  Food insecurity:    Worry: Not on file    Inability: Not on file  . Transportation needs:    Medical: Not on file    Non-medical: Not on file  Tobacco Use  . Smoking status: Never Smoker  . Smokeless tobacco: Never Used  Substance and Sexual Activity  . Alcohol use: Yes    Alcohol/week: 7.0 standard drinks    Types: 7 Glasses of wine per week    Comment: 1 glass red wine each night with dinner  . Drug use: No  . Sexual activity: Not on file  Lifestyle   . Physical activity:    Days per week: Not on file    Minutes per session: Not on file  . Stress: Not on file  Relationships  . Social connections:    Talks on phone: Not on file    Gets together: Not on file    Attends religious service: Not on file    Active member of club or organization: Not on file    Attends meetings of clubs or organizations: Not on file    Relationship status: Not on file  . Intimate partner violence:    Fear of current or ex partner: Not on file    Emotionally abused: Not on file    Physically abused: Not on file    Forced sexual activity: Not on file  Other Topics Concern  . Not on file  Social History Narrative  . Not on file    Past Surgical Hx:  Past Surgical History:  Procedure Laterality Date  . APPENDECTOMY    . BACK SURGERY    . CARDIAC SURGERY    . COLONOSCOPY    . OVARY REMOVE    . ROBOTIC ASSISTED TOTAL HYSTERECTOMY WITH SALPINGECTOMY  04/13/2017   total robotic hysterectomy, left salpingo-oophorectomy, sentinel lymph node identification and biopsy by Dr. Clarene Essex at [facility].  . SVT ABLATION  2013    Past Medical Hx:  Past Medical History:  Diagnosis Date  . Cataract   . Diabetes mellitus without complication ([place])   . Endometrial cancer [facility])     Past Gynecological History:  SVD x 2 No LMP recorded. Patient has had a hysterectomy.  Family Hx:  Family History  Problem Relation Age of Onset  . Diabetes Mother   . Heart disease Father   . Breast cancer Sister   . Colon cancer Neg Hx     Review of Systems:  Constitutional  Feels well,    ENT Normal appearing ears and nares bilaterally Skin/Breast  No rash, sores, jaundice, itching, dryness Cardiovascular  No chest pain, shortness of breath, or edema  Pulmonary  No cough or wheeze.  Gastro Intestinal  No nausea, vomitting, or diarrhoea. No bright red blood per rectum, no abdominal pain, change in bowel movement, or constipation.  Genito Urinary  No frequency, urgency,  dysuria, no postmenopausal bleeding Musculo Skeletal  No myalgia, arthralgia, joint swelling or pain  Neurologic  No weakness, numbness, change in gait,  Psychology  No depression, anxiety, insomnia.   Vitals:  Blood pressure 130/80, pulse 73, temperature 98.3 F (36.8 C), temperature source Oral, resp. rate 18, height '5\' 2"'  (1.575 m), weight 96 lb 12.8 oz (43.9 kg), SpO2 100 %.  Physical Exam: WD in NAD Neck  Supple NROM, without any enlargements.  Lymph Node Survey No cervical supraclavicular or inguinal adenopathy Cardiovascular  Pulse normal rate, regularity and rhythm. S1 and S2 normal.  Lungs  Clear to auscultation  bilateraly, without wheezes/crackles/rhonchi. Good air movement.  Skin  No rash/lesions/breakdown  Psychiatry  Alert and oriented to person, place, and time  Abdomen  Normoactive bowel sounds, abdomen soft, non-tender and thin without evidence of hernia. No masses.  Back No CVA tenderness Genito Urinary  Normal external genitalia. Normal vagina. Normal cuff. No masses or lesions. Rectal  deferred Extremities  No bilateral cyanosis, clubbing or edema.   Thereasa Solo, MD  03/15/2018, 1:47 PM

## 2018-03-15 NOTE — Patient Instructions (Signed)
Please notify Dr Denman George at phone number [phone] if you notice vaginal bleeding, new pelvic or abdominal pains, bloating, feeling full easy, or a change in bladder or bowel function.   Please return to see Dr Denman George in April, 2020.

## 2018-03-16 ENCOUNTER — Telehealth: Payer: Self-pay | Admitting: *Deleted

## 2018-03-16 NOTE — Telephone Encounter (Signed)
Fax mammogram orders to [facility]

## 2018-03-17 ENCOUNTER — Telehealth: Payer: Self-pay | Admitting: *Deleted

## 2018-03-17 NOTE — Telephone Encounter (Signed)
Called and spoke with the patient, moved appt from 4/15 to 4/17

## 2018-03-19 DIAGNOSIS — Z85828 Personal history of other malignant neoplasm of skin: Secondary | ICD-10-CM | POA: Diagnosis not present

## 2018-03-19 DIAGNOSIS — D045 Carcinoma in situ of skin of trunk: Secondary | ICD-10-CM | POA: Diagnosis not present

## 2018-03-19 DIAGNOSIS — L821 Other seborrheic keratosis: Secondary | ICD-10-CM | POA: Diagnosis not present

## 2018-03-19 DIAGNOSIS — D2271 Melanocytic nevi of right lower limb, including hip: Secondary | ICD-10-CM | POA: Diagnosis not present

## 2018-03-19 DIAGNOSIS — D485 Neoplasm of uncertain behavior of skin: Secondary | ICD-10-CM | POA: Diagnosis not present

## 2018-03-19 DIAGNOSIS — D1801 Hemangioma of skin and subcutaneous tissue: Secondary | ICD-10-CM | POA: Diagnosis not present

## 2018-03-19 DIAGNOSIS — D225 Melanocytic nevi of trunk: Secondary | ICD-10-CM | POA: Diagnosis not present

## 2018-03-20 ENCOUNTER — Encounter: Payer: Self-pay | Admitting: Podiatry

## 2018-03-20 NOTE — Progress Notes (Signed)
Subjective: Michel Bickers presents today with  For follow up diabetic foot check and chronic, painful mycotic toenails.   Pain is aggravated when wearing enclosed shoe gear. Pain is getting progressively worse and relieved with periodic professional debridement.  Ms. Magnussen voices no new problems on today's visit.  Objective: Vascular Examination: Capillary refill time <3 seconds x 10 digits Dorsalis pedis and posterior tibial pulses present b/l No digital hair x 10 digits Skin temperature warm to warm b/l  Dermatological Examination: Skin with normal turgor, texture and tone b/l Toenails 1-5 b/l discolored, thick, dystrophic with subungual debris and pain with palpation to nailbeds due to thickness of nails.  Musculoskeletal: Muscle strength 5/5 to all LE muscle groups  Neurological: Sensation intact with 10 gram monofilament. Vibratory sensation intact.  Assessment: Painful onychomycosis toenails 1-5 b/l   Plan: 1. Toenails 1-5 b/l were debrided in length and girth without iatrogenic bleeding. 2. Patient to continue soft, supportive shoe gear 3. Patient to report any pedal injuries to medical professional immediately. 4. Follow up 3 months. Patient/POA to call should there be a concern in the interim.

## 2018-04-05 DIAGNOSIS — D045 Carcinoma in situ of skin of trunk: Secondary | ICD-10-CM | POA: Diagnosis not present

## 2018-04-05 DIAGNOSIS — Z85828 Personal history of other malignant neoplasm of skin: Secondary | ICD-10-CM | POA: Diagnosis not present

## 2018-04-19 ENCOUNTER — Telehealth: Payer: Self-pay

## 2018-04-19 ENCOUNTER — Telehealth: Payer: Self-pay | Admitting: *Deleted

## 2018-04-19 NOTE — Telephone Encounter (Signed)
See previous nurse's note, outgoing call to patient per Joylene John NP regarding pt having white d/c, explained to pt that Dr Denman George was notified and per Joylene John NP, pt can try over the counter Monistat (3 or 7 day treatment) or simply monitor over the next week.  Instructions to contact our office back on next Tues or Wednesday if not improving or before for any fever, change in d/c, or odor.  Pt voiced understanding, and said she may monitor or just go ahead and try monistat, verbalized she will keep [place] posted.  No other needs per pt at this time.

## 2018-04-19 NOTE — Telephone Encounter (Signed)
Returned the patient's call regarding scheduling a mammogram. Per patient Dr. Denman George wanted the scan done at [facility]. Faxed order to [employer]. Patient also stated "please let Dr. Denman George know that whe I was there in November I had no discharge, and now I do. I don't know if that is normal, but I have never had that before. It's a white discharge, it's not heavy, no odor and I don't have to wear a pad or liner." Explained to the patient that I will work on the mammogram and give Dr. Gweneth Fritter APP the message for the discharge. We will call her back.

## 2018-04-22 DIAGNOSIS — H25812 Combined forms of age-related cataract, left eye: Secondary | ICD-10-CM | POA: Diagnosis not present

## 2018-04-22 DIAGNOSIS — H2512 Age-related nuclear cataract, left eye: Secondary | ICD-10-CM | POA: Diagnosis not present

## 2018-05-03 ENCOUNTER — Other Ambulatory Visit: Payer: Self-pay | Admitting: Gynecologic Oncology

## 2018-05-03 ENCOUNTER — Telehealth: Payer: Self-pay

## 2018-05-03 DIAGNOSIS — B379 Candidiasis, unspecified: Secondary | ICD-10-CM

## 2018-05-03 DIAGNOSIS — N898 Other specified noninflammatory disorders of vagina: Secondary | ICD-10-CM

## 2018-05-03 MED ORDER — FLUCONAZOLE 100 MG PO TABS: 100.0000 mg | ORAL_TABLET | Freq: Every day | ORAL | 0 refills | Status: DC

## 2018-05-03 MED ORDER — FLUCONAZOLE 100 MG PO TABS
100.0000 mg | ORAL_TABLET | Freq: Once | ORAL | 0 refills | Status: DC
Start: 2018-05-03 — End: 2018-05-03

## 2018-05-03 NOTE — Progress Notes (Signed)
See RN note.

## 2018-05-03 NOTE — Telephone Encounter (Signed)
Incoming call from pt regarding ongoing vaginal discharge - has treated with 2 courses of Monistat, discharge is thick yellow-green, no odor, feels irritated, and itchy.  Denies taking antibiotics recently.  Reports she did start wearing panties with her work-out pants and thinks that could have caused too much warmth in that area. Reports monistat has helped some but verbalized is tired of using and would like to try something by prescription.  Per Joylene John, will prescribe Diflucan, pt can monitor symptoms over week and if not improved, advised to call office to make appt with Dr Denman George to check discharge.  Pt voiced understanding and no other needs per her at this time.

## 2018-05-11 DIAGNOSIS — Z1231 Encounter for screening mammogram for malignant neoplasm of breast: Secondary | ICD-10-CM | POA: Diagnosis not present

## 2018-05-12 HISTORY — PX: COLONOSCOPY: SHX174

## 2018-05-20 ENCOUNTER — Other Ambulatory Visit: Payer: Self-pay

## 2018-05-20 DIAGNOSIS — B379 Candidiasis, unspecified: Secondary | ICD-10-CM

## 2018-05-20 MED ORDER — FLUCONAZOLE 100 MG PO TABS: 100.0000 mg | ORAL_TABLET | Freq: Every day | ORAL | 0 refills | Status: DC

## 2018-05-20 NOTE — Telephone Encounter (Signed)
Incoming call from pt with request for refill of Diflucan. Reports it has helped a great deal but still has just a little off-white discharge and slight irritation or itching.  Pt wants to try one more dose of Diflucan and if it doesn't help then she verbalized will call for appt with Dr Denman George.  Notified Joylene John NP and refill placed per her verbal orders.  Pt will call our office for appt, if symptoms not improving or worsening.  No other needs per pt at this time.

## 2018-06-03 ENCOUNTER — Ambulatory Visit (INDEPENDENT_AMBULATORY_CARE_PROVIDER_SITE_OTHER): Payer: Medicare Other | Admitting: Podiatry

## 2018-06-03 ENCOUNTER — Encounter: Payer: Self-pay | Admitting: Podiatry

## 2018-06-03 DIAGNOSIS — B351 Tinea unguium: Secondary | ICD-10-CM | POA: Diagnosis not present

## 2018-06-03 DIAGNOSIS — M79675 Pain in left toe(s): Secondary | ICD-10-CM

## 2018-06-03 DIAGNOSIS — M79674 Pain in right toe(s): Secondary | ICD-10-CM

## 2018-06-03 NOTE — Progress Notes (Signed)
Subjective: Kimberly [name] presents today with painful, thick toenails 1-5 b/l that she cannot cut and which interfere with daily activities.  Pain is aggravated when wearing enclosed shoe gear.  Today she relates ingrown toenails of both great toenails which are tender when wearing shoe gear.  Kimberly Redwood, MD is her PCP.   Current Outpatient Medications:  Marland Kitchen  DUREZOL 0.05 % EMUL, INSTALL 1 DROP INTO LEFT EYE 4 TIMES DAILY USE AS DIRECTED AFTER SURGERY, Disp: , Rfl: 0 .  fluconazole (DIFLUCAN) 100 MG tablet, Take 1 tablet (100 mg total) by mouth daily., Disp: 1 tablet, Rfl: 0 .  metFORMIN (GLUCOPHAGE) 1000 MG tablet, Take 1,000 mg by mouth 2 (two) times daily with a meal., Disp: , Rfl:  .  moxifloxacin (VIGAMOX) 0.5 % ophthalmic solution, INSTILL 1 DROP INTO LEFT EYE 4 TIMES DAILY STARTING 2 DAYS PRIOR TO SURGERY AND 2 DROPS THE MORNING OF SURGERY. CONTINUE TO USE AFTER SURGER, Disp: , Rfl: 1 .  neomycin-polymyxin-hydrocortisone (CORTISPORIN) OTIC solution, Apply 1-2 drops to toe after soaking BID, Disp: 10 mL, Rfl: 1 .  rosuvastatin (CRESTOR) 10 MG tablet, Take 10 mg by mouth daily., Disp: , Rfl:  .  zolpidem (AMBIEN CR) 12.5 MG CR tablet, Take 12.5 mg by mouth at bedtime as needed for sleep., Disp: , Rfl:   Current Facility-Administered Medications:  .  0.9 %  sodium chloride infusion, 500 mL, Intravenous, Continuous, Milus Banister, MD  Allergies  Allergen Reactions  . Sulfur Hives    Objective:  Vascular Examination: Capillary refill time less than 3 seconds to 10 digits  Dorsalis pedis and posterior tibial pulses present bilaterally  No digital hair x10 digits  Skin temperature gradient within normal limits bilaterally   Dermatological Examination: Skin with normal turgor, texture and tone b/l  Toenails 1-5 b/l discolored, thick, dystrophic with subungual debris and pain with palpation to nailbeds due to thickness of nails.  Incurvated nail borders medial and lateral  border of both great toes are tender to palpation.  There is no erythema, no edema, no drainage noted.  No open wounds.  No interdigital macerations noted.  Musculoskeletal: Muscle strength 5/5 to all LE muscle groups  No gross bony deformities b/l.  No pain, crepitus or joint limitation noted with ROM.   Neurological: Sensation intact with 10 gram monofilament. Vibratory sensation intact.  Assessment: Painful onychomycosis toenails 1-5 b/l  Ingrown toenails bilateral great toes bilateral borders, noninfected  Plan: 1. Medicare ABN signed for 2020. 2. Toenails 1-5 b/l were debrided in length and girth without iatrogenic bleeding.  Offending nail borders debrided and curettage.  Borders cleansed with alcohol.  Triple antibiotic ointment was applied to both digits.  Patient noted relief posttreatment.  No further treatment required by Ms.Moman on today. 3. Patient to continue soft, supportive shoe gear 4. Patient to report any pedal injuries to medical professional immediately. 5. Follow up 3 months. Patient/POA to call should there be a concern in the interim.

## 2018-06-03 NOTE — Patient Instructions (Signed)

## 2018-06-16 ENCOUNTER — Encounter: Payer: Self-pay | Admitting: Podiatry

## 2018-06-30 ENCOUNTER — Telehealth: Payer: Self-pay | Admitting: *Deleted

## 2018-06-30 NOTE — Telephone Encounter (Signed)
Called patient to ask if she would consider coming @ 4 pm on 07-01-18, patient agreed to new time

## 2018-07-01 ENCOUNTER — Other Ambulatory Visit: Payer: Self-pay

## 2018-07-01 ENCOUNTER — Ambulatory Visit
Admission: RE | Admit: 2018-07-01 | Discharge: 2018-07-01 | Disposition: A | Discharge status: Home / Self Care | Payer: Medicare Other | Source: Ambulatory Visit | Attending: Radiation Oncology | Admitting: Radiation Oncology

## 2018-07-01 ENCOUNTER — Ambulatory Visit: Payer: Medicare Other | Attending: Radiation Oncology

## 2018-07-01 ENCOUNTER — Encounter: Payer: Self-pay | Admitting: Radiation Oncology

## 2018-07-01 VITALS — BP 128/66 | HR 87 | Temp 98.3°F | Resp 16 | Wt 97.0 lb

## 2018-07-01 DIAGNOSIS — Z8542 Personal history of malignant neoplasm of other parts of uterus: Secondary | ICD-10-CM | POA: Diagnosis not present

## 2018-07-01 DIAGNOSIS — C541 Malignant neoplasm of endometrium: Secondary | ICD-10-CM | POA: Diagnosis not present

## 2018-07-01 DIAGNOSIS — Z79899 Other long term (current) drug therapy: Secondary | ICD-10-CM | POA: Diagnosis not present

## 2018-07-01 DIAGNOSIS — Z923 Personal history of irradiation: Secondary | ICD-10-CM | POA: Diagnosis not present

## 2018-07-01 DIAGNOSIS — Z08 Encounter for follow-up examination after completed treatment for malignant neoplasm: Secondary | ICD-10-CM | POA: Diagnosis not present

## 2018-07-01 NOTE — Progress Notes (Signed)
Pt presents today for f/u with Dr. Sondra Come. Pt is unaccompanied. Pt denies c/o pain. Pt denies dysuria/hematuria. Pt denies vaginal bleeding/discharge. Pt denies rectal bleeding, diarrhea/constipation.   Pt is asking for a CT scan to evaluate any lymph node metastasis.   BP 128/66 (BP Location: Left Arm, Patient Position: Sitting)   Pulse 87   Temp 98.3 F (36.8 C) (Oral)   Resp 16   Wt 97 lb (44 kg)   SpO2 99%   BMI 17.74 kg/m   Wt Readings from Last 3 Encounters:  07/01/18 97 lb (44 kg)  03/15/18 96 lb 12.8 oz (43.9 kg)  01/07/18 98 lb 3.2 oz (44.5 kg)   Loma Sousa, RN BSN

## 2018-07-01 NOTE — Progress Notes (Addendum)
Radiation Oncology         [phone] ________________________________  Name: Kimberly [name] MRN: [id number]  Date: 07/01/2018  DOB: [birth year]  Follow-Up Visit Note  CC: Marton Redwood, MD  Marton Redwood, MD    ICD-10-CM   1. Endometrial cancer determined by uterine biopsy ([place]) C54.1 CBC with Differential ([place])    CMP ([place] only)    CT Abdomen Pelvis W Contrast    Diagnosis:   68 y.o. female with Stage IB, grade 3 endometrioid adenocarcinoma of the endometrium, multifocal lymphovascular space invasion, isolated tumor cells within left external iliac lymph node chain  Interval Since Last Radiation:  11 months  Radiation treatment dates: 06/11/17-07/15/17 (external beam); 07/21/17-08/03/17 (brachytherapy) Site/dose: 1) Pelvis / 45 Gy in 25 fractions 2) Vaginal cuff / 18 Gy in 3 fractions  Narrative:  The patient returns today for routine follow-up. She last saw Dr. Denman George 3 months ago with no signs of recurrence. She states that she had cataract surgery and reports improved vision. She denies any pain. She denies dysuria or hematuria. She states that she had a yeast infection that was treated with Diflucan. She reports these symptoms have since resolved. She denies vaginal bleeding or discharge. She denies rectal bleeding, diarrhea, or constipation. She denies any issues with her appetite. She is not using her vaginal dilator. She states that she is up-to-date on her mammograms.               ALLERGIES:  is allergic to sulfur.  Meds: Current Outpatient Medications  Medication Sig Dispense Refill  . metFORMIN (GLUCOPHAGE) 1000 MG tablet Take 1,000 mg by mouth 2 (two) times daily with a meal.    . rosuvastatin (CRESTOR) 10 MG tablet Take 10 mg by mouth daily.    Marland Kitchen tretinoin (RETIN-A) 0.1 % cream APPLY A THIN LAYER TO FACE ONCE DAILY AT NIGHT    . zolpidem (AMBIEN CR) 12.5 MG CR tablet Take 12.5 mg by mouth at bedtime as needed for sleep.     Current  Facility-Administered Medications  Medication Dose Route Frequency Provider Last Rate Last Dose  . 0.9 %  sodium chloride infusion  500 mL Intravenous Continuous Milus Banister, MD        Physical Findings: The patient is in no acute distress. Patient is alert and oriented.  weight is 97 lb (44 kg). Her oral temperature is 98.3 F (36.8 C). Her blood pressure is 128/66 and her pulse is 87. Her respiration is 16 and oxygen saturation is 99%.   Lungs are clear to auscultation bilaterally. Heart has regular rate and rhythm. No palpable cervical, supraclavicular, or axillary adenopathy. Abdomen soft, non-tender, normal bowel sounds.  On pelvic examination the external genitalia were unremarkable. A speculum exam was performed. There are no mucosal lesions noted in the vaginal vault. Vaginal cuff intact. On bimanual examination there were no pelvic masses appreciated.  Lab Findings: Lab Results  Component Value Date   WBC 7.0 05/27/2017   HGB 12.5 05/27/2017   HCT 37.8 05/27/2017   MCV 93.6 05/27/2017   PLT 328 05/27/2017    Radiographic Findings: No results found.  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 No evidence of recurrence on clinical exam. She was encouraged to  use her vaginal dilator. Patient is inquiring about post-treatment CT scan. This would be reasonable given her high-grade lesion with multifocal  lymphovascular space invasion and isolated tumor cells on lymph node evaluation.  Plan:  Patient will follow up with Dr. Denman George in April and return to radiation oncology in July. Will order CT scan of abdomen/pelvis and need to check blood work prior.   ____________________________________  Blair Promise, PhD, MD  This document serves as a record of services personally performed by Gery Pray, MD. It was created on his behalf by  Rae Lips, a trained medical scribe. The creation of this record is based on the scribe's personal observations and the provider's statements to them. This document has been checked and approved by the attending provider.

## 2018-07-06 DIAGNOSIS — L298 Other pruritus: Secondary | ICD-10-CM | POA: Diagnosis not present

## 2018-07-06 DIAGNOSIS — Z85828 Personal history of other malignant neoplasm of skin: Secondary | ICD-10-CM | POA: Diagnosis not present

## 2018-07-06 DIAGNOSIS — L910 Hypertrophic scar: Secondary | ICD-10-CM | POA: Diagnosis not present

## 2018-07-08 ENCOUNTER — Ambulatory Visit: Payer: Self-pay | Admitting: Radiation Oncology

## 2018-08-02 DIAGNOSIS — M8580 Other specified disorders of bone density and structure, unspecified site: Secondary | ICD-10-CM | POA: Diagnosis not present

## 2018-08-02 DIAGNOSIS — C541 Malignant neoplasm of endometrium: Secondary | ICD-10-CM | POA: Diagnosis not present

## 2018-08-02 DIAGNOSIS — Z1331 Encounter for screening for depression: Secondary | ICD-10-CM | POA: Diagnosis not present

## 2018-08-02 DIAGNOSIS — Z681 Body mass index (BMI) 19 or less, adult: Secondary | ICD-10-CM | POA: Diagnosis not present

## 2018-08-02 DIAGNOSIS — E119 Type 2 diabetes mellitus without complications: Secondary | ICD-10-CM | POA: Diagnosis not present

## 2018-08-02 DIAGNOSIS — E7849 Other hyperlipidemia: Secondary | ICD-10-CM | POA: Diagnosis not present

## 2018-08-06 ENCOUNTER — Telehealth: Payer: Self-pay | Admitting: *Deleted

## 2018-08-06 NOTE — Telephone Encounter (Signed)
Called and spoke with the patient regarding her appt for 4/17. Appt moved due to [id number] and hospital policy to move all routine appts out several weeks. Explained that she will receive a call the day before to be prescreened and again the day of the appt at the front desk; along with a  Temperature check. Also explained the no visitor policy.

## 2018-08-25 ENCOUNTER — Ambulatory Visit: Payer: Medicare Other | Admitting: Gynecologic Oncology

## 2018-08-27 ENCOUNTER — Ambulatory Visit: Payer: Medicare Other | Admitting: Gynecologic Oncology

## 2018-09-02 ENCOUNTER — Encounter: Payer: Self-pay | Admitting: Podiatry

## 2018-09-02 ENCOUNTER — Other Ambulatory Visit: Payer: Self-pay

## 2018-09-02 ENCOUNTER — Ambulatory Visit (INDEPENDENT_AMBULATORY_CARE_PROVIDER_SITE_OTHER): Payer: Medicare Other | Admitting: Podiatry

## 2018-09-02 VITALS — Temp 97.3°F

## 2018-09-02 DIAGNOSIS — M79675 Pain in left toe(s): Secondary | ICD-10-CM

## 2018-09-02 DIAGNOSIS — B351 Tinea unguium: Secondary | ICD-10-CM

## 2018-09-02 DIAGNOSIS — E119 Type 2 diabetes mellitus without complications: Secondary | ICD-10-CM

## 2018-09-02 DIAGNOSIS — L84 Corns and callosities: Secondary | ICD-10-CM | POA: Diagnosis not present

## 2018-09-02 DIAGNOSIS — M79674 Pain in right toe(s): Secondary | ICD-10-CM | POA: Diagnosis not present

## 2018-09-02 NOTE — Patient Instructions (Signed)

## 2018-09-02 NOTE — Progress Notes (Signed)
Subjective: Patient presents today for preventative diabetic foot care. Today, she complains of painful callus right great toe which developed after she started running. She runs 3 miles/day. She wears Rolena Infante and [employer] and [employer].    Marton Redwood, MD is her PCP and last visit was 08/08/2018.   Current Outpatient Medications:  .  metFORMIN (GLUCOPHAGE) 1000 MG tablet, Take 1,000 mg by mouth 2 (two) times daily with a meal., Disp: , Rfl:  .  rosuvastatin (CRESTOR) 10 MG tablet, Take 10 mg by mouth daily., Disp: , Rfl:  .  tretinoin (RETIN-A) 0.1 % cream, APPLY A THIN LAYER TO FACE ONCE DAILY AT NIGHT, Disp: , Rfl:  .  zolpidem (AMBIEN CR) 12.5 MG CR tablet, Take 12.5 mg by mouth at bedtime as needed for sleep., Disp: , Rfl:   Current Facility-Administered Medications:  .  0.9 %  sodium chloride infusion, 500 mL, Intravenous, Continuous, Milus Banister, MD   Allergies  Allergen Reactions  . Sulfur Hives     Objective:  Vascular Examination: Capillary refill time less than 3 seconds x 10 digits.  Dorsalis pedis pulses present b/l.  Posterior tibial pulses present.  Digital hair absent x 10 digits.  Skin temperature gradient WNL  b/l  Dermatological Examination: Skin with normal turgor, texture and tone b/l.  Toenails 1-5 b/l discolored, thick, dystrophic with subungual debris and pain with palpation to nailbeds due to thickness of nails.  Hyperkeratotic lesion plantar hallux right foot. No erythema, no edema, no drainage, no flocculence.  Musculoskeletal: Muscle strength 5/5 to all LE muscle groups  Neurological: Sensation intact with 10 gram monofilament.  Vibratory sensation intact.  Assessment: 1. Painful onychomycosis toenails 1-5 b/l 2. Callus right hallux 3. NIDDM  Plan: 1. Toenails 1-5 b/l were debrided in length and girth without iatrogenic bleeding. Calluses pared right hallux utilizing sterile scalpel blade without incident. 2. Patient to  continue soft, supportive shoe gear daily. 3. Patient to report any pedal injuries to medical professional immediately. 4. Follow up 3 months. 5. Patient/POA to call should there be a concern in the interim.

## 2018-10-15 ENCOUNTER — Other Ambulatory Visit: Payer: Self-pay

## 2018-10-15 ENCOUNTER — Inpatient Hospital Stay: Payer: Medicare Other | Attending: Gynecologic Oncology | Admitting: Gynecologic Oncology

## 2018-10-15 ENCOUNTER — Encounter: Payer: Self-pay | Admitting: Gynecologic Oncology

## 2018-10-15 VITALS — BP 120/59 | HR 69 | Temp 99.1°F | Resp 18 | Ht 62.0 in | Wt 101.4 lb

## 2018-10-15 DIAGNOSIS — Z90722 Acquired absence of ovaries, bilateral: Secondary | ICD-10-CM | POA: Diagnosis not present

## 2018-10-15 DIAGNOSIS — Z923 Personal history of irradiation: Secondary | ICD-10-CM | POA: Diagnosis not present

## 2018-10-15 DIAGNOSIS — Z90710 Acquired absence of both cervix and uterus: Secondary | ICD-10-CM

## 2018-10-15 DIAGNOSIS — C541 Malignant neoplasm of endometrium: Secondary | ICD-10-CM | POA: Diagnosis not present

## 2018-10-15 NOTE — Patient Instructions (Signed)
Please notify Dr Denman George at phone number [phone] if you notice vaginal bleeding, new pelvic or abdominal pains, bloating, feeling full easy, or a change in bladder or bowel function.   Please contact Dr Serita Grit office (at [phone]) in September, 2020 (or have Dr Clabe Seal staff do this) to request an appointment with her for December, 2020.

## 2018-10-15 NOTE — Progress Notes (Signed)
Follow-up Note: Gyn-Onc  Consult was requested by Dr. Mody for the evaluation of Kimberly [name] 68 y.o. female  CC:  Chief Complaint  Patient presents with  . Endometrial cancer determined by uterine biopsy (HCC)    Assessment/Plan:  Ms. Kimberly [name]  is a 68 y.o.  year old with stage IB grade 3 endometrioid endometrial cancer (MSI low/stable). Surgery December, 2018. S/p adjuvant external beam and bra chytherapy completed March, 2019. No evidence of disease recurrence. I recommend continuing 3 monthly evaluations until March, 2021 I will see her in 6 months, Dr Kinard will see her in 3 months. Counseled regarding symtpoms of recurrence.  HPI: Kimberly [name] is a 68 year old P2 who is seen in consultation at the request of Dr Mody for grade 2 endometrial cancer.  The patient has a history of postmenopausal spotting since April, 2018. She was seen by a PA in her PCP's office in July, 2018 and a pap test was performed which per patient was negative and therefore no intervention followed. The patient continued to have spotting and informed her PCP, Dr Shaw, of this in October during her annual physical. He then promptly referred her to Dr Mody's office 02/27/17 and a TVUS was performed which showed a uterus measuring normal dimensions and a 6mm endometrial stripe.  On 02/28/17 an office pipelle biopsy was performed and revealed a FIGO grade 2 endometrioid endometrial cancer.  The patient has a history of a prior USO (? Right) many years ago for a benign cyst on the ovary. She has also had an open appendectomy remotely. She is thin (103lbs) and otherwise very healthy.  She has had L5 surgery through posterior approach.  Her sister has a history of breast and ovarian cancer, and while she was not tested, Ms Hammontree underwent genetic testing for BRCA and MLH/MSH abnormalities which were unremarkable.    Interval Hx:  On 04/13/17 she underwent robotic assisted total hysterectomy BSO, SLN  biopsy with Dr John Soper at [facility]. Final pathology revealed a deeply invasive grade 3 endometrioid tumor with multifocal LVSI present and ITCs present in a left external iliac SLN. Assiged stage IB grade 3. MSI low (stable). Due to these high risk features, she was determined to be at high risk for recurrence and adjuvant external beam radiation with vaginal brachytherapy were recommended.   She went on to receive external beam and vaginal brachytherapy completed in February, 2019. She tolerated therapy well.  She is doing well without any complaints.   Current Meds:  Outpatient Encounter Medications as of 10/15/2018  Medication Sig  . metFORMIN (GLUCOPHAGE) 1000 MG tablet Take 1,000 mg by mouth 2 (two) times daily with a meal.  . rosuvastatin (CRESTOR) 10 MG tablet Take 10 mg by mouth daily.  . tretinoin (RETIN-A) 0.1 % cream APPLY A THIN LAYER TO FACE ONCE DAILY AT NIGHT  . zolpidem (AMBIEN CR) 12.5 MG CR tablet Take 12.5 mg by mouth at bedtime as needed for sleep.   Facility-Administered Encounter Medications as of 10/15/2018  Medication  . 0.9 %  sodium chloride infusion    Allergy:  Allergies  Allergen Reactions  . Sulfur Hives    Social Hx:   Social History   Socioeconomic History  . Marital status: Divorced    Spouse name: Not on file  . Number of children: Not on file  . Years of education: Not on file  . Highest education level: Not on file  Occupational History  . Not on file    Social Needs  . Financial resource strain: Not on file  . Food insecurity:    Worry: Not on file    Inability: Not on file  . Transportation needs:    Medical: Not on file    Non-medical: Not on file  Tobacco Use  . Smoking status: Never Smoker  . Smokeless tobacco: Never Used  Substance and Sexual Activity  . Alcohol use: Yes    Alcohol/week: 7.0 standard drinks    Types: 7 Glasses of wine per week    Comment: 1 glass red wine each night with dinner  . Drug use: No  . Sexual activity:  Not on file  Lifestyle  . Physical activity:    Days per week: Not on file    Minutes per session: Not on file  . Stress: Not on file  Relationships  . Social connections:    Talks on phone: Not on file    Gets together: Not on file    Attends religious service: Not on file    Active member of club or organization: Not on file    Attends meetings of clubs or organizations: Not on file    Relationship status: Not on file  . Intimate partner violence:    Fear of current or ex partner: Not on file    Emotionally abused: Not on file    Physically abused: Not on file    Forced sexual activity: Not on file  Other Topics Concern  . Not on file  Social History Narrative  . Not on file    Past Surgical Hx:  Past Surgical History:  Procedure Laterality Date  . APPENDECTOMY    . BACK SURGERY    . CARDIAC SURGERY    . COLONOSCOPY    . OVARY REMOVE    . ROBOTIC ASSISTED TOTAL HYSTERECTOMY WITH SALPINGECTOMY  04/13/2017   total robotic hysterectomy, left salpingo-oophorectomy, sentinel lymph node identification and biopsy by Dr. Soper at [facility].  . SVT ABLATION  2013    Past Medical Hx:  Past Medical History:  Diagnosis Date  . Cataract   . Diabetes mellitus without complication (HCC)   . Endometrial cancer (HCC)     Past Gynecological History:  SVD x 2 No LMP recorded. Patient has had a hysterectomy.  Family Hx:  Family History  Problem Relation Age of Onset  . Diabetes Mother   . Heart disease Father   . Breast cancer Sister   . Colon cancer Neg Hx     Review of Systems:  Constitutional  Feels well,    ENT Normal appearing ears and nares bilaterally Skin/Breast  No rash, sores, jaundice, itching, dryness Cardiovascular  No chest pain, shortness of breath, or edema  Pulmonary  No cough or wheeze.  Gastro Intestinal  No nausea, vomitting, or diarrhoea. No bright red blood per rectum, no abdominal pain, change in bowel movement, or constipation.  Genito Urinary   No frequency, urgency, dysuria, no postmenopausal bleeding Musculo Skeletal  No myalgia, arthralgia, joint swelling or pain  Neurologic  No weakness, numbness, change in gait,  Psychology  No depression, anxiety, insomnia.   Vitals:  Blood pressure (!) 120/59, pulse 69, temperature 99.1 F (37.3 C), temperature source Oral, resp. rate 18, height 5' 2" (1.575 m), weight 101 lb 6.4 oz (46 kg), SpO2 98 %.  Physical Exam: WD in NAD Neck  Supple NROM, without any enlargements.  Lymph Node Survey No cervical supraclavicular or inguinal adenopathy Cardiovascular  Pulse normal rate,   regularity and rhythm. S1 and S2 normal.  Lungs  Clear to auscultation bilateraly, without wheezes/crackles/rhonchi. Good air movement.  Skin  No rash/lesions/breakdown  Psychiatry  Alert and oriented to person, place, and time  Abdomen  Normoactive bowel sounds, abdomen soft, non-tender and thin without evidence of hernia. No masses.  Back No CVA tenderness Genito Urinary  Normal external genitalia. + rectocele, shortened atrophic vagina with ring at mid point consistent with radiation changes.  Rectal  deferred Extremities  No bilateral cyanosis, clubbing or edema.   Emma C Rossi, MD  10/15/2018, 3:15 PM     

## 2018-10-26 ENCOUNTER — Telehealth: Payer: Self-pay | Admitting: Oncology

## 2018-10-26 NOTE — Telephone Encounter (Signed)
Was notified by Sharee Pimple, RN, Dr. Clabe Seal nurse that patient called with cramping yesterday.  Call Kimberly [name] and she said she had cramping, just like a menstrual period, that started at 1 pm yesterday and lasted until bedtime.  It is gone today.  She denies having any vaginal bleeding and said she did not do any unusual activity that would cause cramping.  She will call back if it happens again.

## 2018-12-03 ENCOUNTER — Ambulatory Visit (INDEPENDENT_AMBULATORY_CARE_PROVIDER_SITE_OTHER): Payer: Medicare Other | Admitting: Podiatry

## 2018-12-03 ENCOUNTER — Other Ambulatory Visit: Payer: Self-pay

## 2018-12-03 ENCOUNTER — Encounter: Payer: Self-pay | Admitting: Podiatry

## 2018-12-03 DIAGNOSIS — L84 Corns and callosities: Secondary | ICD-10-CM | POA: Diagnosis not present

## 2018-12-03 DIAGNOSIS — E119 Type 2 diabetes mellitus without complications: Secondary | ICD-10-CM

## 2018-12-03 NOTE — Patient Instructions (Signed)
Diabetes Mellitus and Foot Care Foot care is an important part of your health, especially when you have diabetes. Diabetes may cause you to have problems because of poor blood flow (circulation) to your feet and legs, which can cause your skin to:  Become thinner and drier.  Break more easily.  Heal more slowly.  Peel and crack. You may also have nerve damage (neuropathy) in your legs and feet, causing decreased feeling in them. This means that you may not notice minor injuries to your feet that could lead to more serious problems. Noticing and addressing any potential problems early is the best way to prevent future foot problems. How to care for your feet Foot hygiene  Wash your feet daily with warm water and mild soap. Do not use hot water. Then, pat your feet and the areas between your toes until they are completely dry. Do not soak your feet as this can dry your skin.  Trim your toenails straight across. Do not dig under them or around the cuticle. File the edges of your nails with an emery board or nail file.  Apply a moisturizing lotion or petroleum jelly to the skin on your feet and to dry, brittle toenails. Use lotion that does not contain alcohol and is unscented. Do not apply lotion between your toes. Shoes and socks  Wear clean socks or stockings every day. Make sure they are not too tight. Do not wear knee-high stockings since they may decrease blood flow to your legs.  Wear shoes that fit properly and have enough cushioning. Always look in your shoes before you put them on to be sure there are no objects inside.  To break in new shoes, wear them for just a few hours a day. This prevents injuries on your feet. Wounds, scrapes, corns, and calluses  Check your feet daily for blisters, cuts, bruises, sores, and redness. If you cannot see the bottom of your feet, use a mirror or ask someone for help.  Do not cut corns or calluses or try to remove them with medicine.  If you  find a minor scrape, cut, or break in the skin on your feet, keep it and the skin around it clean and dry. You may clean these areas with mild soap and water. Do not clean the area with peroxide, alcohol, or iodine.  If you have a wound, scrape, corn, or callus on your foot, look at it several times a day to make sure it is healing and not infected. Check for: ? Redness, swelling, or pain. ? Fluid or blood. ? Warmth. ? Pus or a bad smell. General instructions  Do not cross your legs. This may decrease blood flow to your feet.  Do not use heating pads or hot water bottles on your feet. They may burn your skin. If you have lost feeling in your feet or legs, you may not know this is happening until it is too late.  Protect your feet from hot and cold by wearing shoes, such as at the beach or on hot pavement.  Schedule a complete foot exam at least once a year (annually) or more often if you have foot problems. If you have foot problems, report any cuts, sores, or bruises to your health care provider immediately. Contact a health care provider if:  You have a medical condition that increases your risk of infection and you have any cuts, sores, or bruises on your feet.  You have an injury that is not   healing.  You have redness on your legs or feet.  You feel burning or tingling in your legs or feet.  You have pain or cramps in your legs and feet.  Your legs or feet are numb.  Your feet always feel cold.  You have pain around a toenail. Get help right away if:  You have a wound, scrape, corn, or callus on your foot and: ? You have pain, swelling, or redness that gets worse. ? You have fluid or blood coming from the wound, scrape, corn, or callus. ? Your wound, scrape, corn, or callus feels warm to the touch. ? You have pus or a bad smell coming from the wound, scrape, corn, or callus. ? You have a fever. ? You have a red line going up your leg. Summary  Check your feet every day  for cuts, sores, red spots, swelling, and blisters.  Moisturize feet and legs daily.  Wear shoes that fit properly and have enough cushioning.  If you have foot problems, report any cuts, sores, or bruises to your health care provider immediately.  Schedule a complete foot exam at least once a year (annually) or more often if you have foot problems. This information is not intended to replace advice given to you by your health care provider. Make sure you discuss any questions you have with your health care provider. Document Released: 04/25/2000 Document Revised: 06/10/2017 Document Reviewed: 05/30/2016 Elsevier Patient Education  2020 [employer] Inc[employer]   Onychomycosis/Fungal Toenails  WHAT IS IT? An infection that lies within the keratin of your nail plate that is caused by a fungus.  WHY ME? Fungal infections affect all ages, sexes, races, and creeds.  There may be many factors that predispose you to a fungal infection such as age, coexisting medical conditions such as diabetes, or an autoimmune disease; stress, medications, fatigue, genetics, etc.  Bottom line: fungus thrives in a warm, moist environment and your shoes offer such a location.  IS IT CONTAGIOUS? Theoretically, yes.  You do not want to share shoes, nail clippers or files with someone who has fungal toenails.  Walking around barefoot in the same room or sleeping in the same bed is unlikely to transfer the organism.  It is important to realize, however, that fungus can spread easily from one nail to the next on the same foot.  HOW DO WE TREAT THIS?  There are several ways to treat this condition.  Treatment may depend on many factors such as age, medications, pregnancy, liver and kidney conditions, etc.  It is best to ask your doctor which options are available to you.  1. No treatment.   Unlike many other medical concerns, you can live with this condition.  However for many people this can be a painful condition and may lead to  ingrown toenails or a bacterial infection.  It is recommended that you keep the nails cut short to help reduce the amount of fungal nail. 2. Topical treatment.  These range from herbal remedies to prescription strength nail lacquers.  About 40-50% effective, topicals require twice daily application for approximately 9 to 12 months or until an entirely new nail has grown out.  The most effective topicals are medical grade medications available through physicians offices. 3. Oral antifungal medications.  With an 80-90% cure rate, the most common oral medication requires 3 to 4 months of therapy and stays in your system for a year as the new nail grows out.  Oral antifungal medications do require   blood work to make sure it is a safe drug for you.  A liver function panel will be performed prior to starting the medication and after the first month of treatment.  It is important to have the blood work performed to avoid any harmful side effects.  In general, this medication safe but blood work is required. 4. Laser Therapy.  This treatment is performed by applying a specialized laser to the affected nail plate.  This therapy is noninvasive, fast, and non-painful.  It is not covered by insurance and is therefore, out of pocket.  The results have been very good with a 80-95% cure rate.  [facility] is the only practice in the area to offer this therapy. 5. Permanent Nail Avulsion.  Removing the entire nail so that a new nail will not grow back. 

## 2018-12-05 NOTE — Progress Notes (Signed)
Subjective: Kimberly [name] presents to clinic for prevenative diabetic foot care with callus and corn which are aggravated when weightbearing with and without shoe gear.  This pain limits her daily activities. Pain symptoms resolve with periodic professional debridement.  Marton Redwood, MD is her PCP.   She voices no new pedal concerns on today's visit.  Last visit was 08/02/2018.   Current Outpatient Medications:  .  metFORMIN (GLUCOPHAGE) 1000 MG tablet, Take 1,000 mg by mouth 2 (two) times daily with a meal., Disp: , Rfl:  .  rosuvastatin (CRESTOR) 10 MG tablet, Take 10 mg by mouth daily., Disp: , Rfl:  .  tretinoin (RETIN-A) 0.1 % cream, APPLY A THIN LAYER TO FACE ONCE DAILY AT NIGHT, Disp: , Rfl:  .  triamcinolone cream (KENALOG) 0.1 %, , Disp: , Rfl:  .  zolpidem (AMBIEN CR) 12.5 MG CR tablet, Take 12.5 mg by mouth at bedtime as needed for sleep., Disp: , Rfl:   Current Facility-Administered Medications:  .  0.9 %  sodium chloride infusion, 500 mL, Intravenous, Continuous, Milus Banister, MD   Allergies  Allergen Reactions  . Sulfur Hives     Objective: There were no vitals filed for this visit.  Physical Examination:  Vascular  Examination: Capillary refill time less than 3 seconds x 10 digits.  Palpable DP/PT pulses b/l.  Digital hair absent b/l.  No edema noted b/l.  Skin temperature gradient WNL b/l.  Dermatological Examination: Skin with normal turgor, texture and tone b/l.  No open wounds b/l.  No interdigital macerations noted b/l.  Toenails 1-5 b/l adequate length today.  Hyperkeratotic lesion plantar hallux right foot and lateral aspect right 3rd digit with tenderness to palpation. No edema, no erythema, no drainage, no flocculence.  Musculoskeletal Examination: Muscle strength 5/5 to all muscle groups b/l.  No pain, crepitus or joint discomfort with active/passive ROM.  Neurological Examination: Sensation intact 5/5 b/l with 10 gram  monofilament.  Vibratory sensation intact b/l.  Proprioceptive sensation intact b/l.  Assessment: 1. Calluses right hallux 2. Corn right 3rd digit 3. NIDDM  Plan: Callus pared right hallux utilizing sterile scalpel blade without incident.Corn(s) pared right 3rd digit utilizing sterile scalpel blade without incident. Continue soft, supportive shoe gear daily. Report any pedal injuries to medical professional. Follow up 3 months. Patient/POA to call should there be a question/concern in there interim.

## 2018-12-22 ENCOUNTER — Telehealth: Payer: Self-pay | Admitting: *Deleted

## 2018-12-22 NOTE — Telephone Encounter (Signed)
CALLED PATIENT TO INFORM OF STAT LABS ON 12-29-18 @ 11:15 AM @ [facility] AND HER CT ON 12-29-18 - ARRIVAL TIME- 12:15 PM @ [facility], PATIENT TO BE NPO- 4 HRS. PRIOR TO TEST, PATIENT TO PICK UP CONTRAST ON 12-28-18 @ [facility], SPOKE WITH PATIENT AND SHE IS AWARE OF ALL APPTS. AND PREP

## 2018-12-23 DIAGNOSIS — H40023 Open angle with borderline findings, high risk, bilateral: Secondary | ICD-10-CM | POA: Diagnosis not present

## 2018-12-23 DIAGNOSIS — H25811 Combined forms of age-related cataract, right eye: Secondary | ICD-10-CM | POA: Diagnosis not present

## 2018-12-29 ENCOUNTER — Other Ambulatory Visit: Payer: Self-pay

## 2018-12-29 ENCOUNTER — Ambulatory Visit
Admission: RE | Admit: 2018-12-29 | Discharge: 2018-12-29 | Disposition: A | Discharge status: Home / Self Care | Payer: Medicare Other | Source: Ambulatory Visit | Attending: Radiation Oncology | Admitting: Radiation Oncology

## 2018-12-29 ENCOUNTER — Ambulatory Visit (HOSPITAL_COMMUNITY)
Admission: RE | Admit: 2018-12-29 | Discharge: 2018-12-29 | Disposition: A | Discharge status: Home / Self Care | Payer: Medicare Other | Source: Ambulatory Visit | Attending: Radiation Oncology | Admitting: Radiation Oncology

## 2018-12-29 DIAGNOSIS — Z923 Personal history of irradiation: Secondary | ICD-10-CM | POA: Insufficient documentation

## 2018-12-29 DIAGNOSIS — C541 Malignant neoplasm of endometrium: Secondary | ICD-10-CM | POA: Insufficient documentation

## 2018-12-29 DIAGNOSIS — Z8542 Personal history of malignant neoplasm of other parts of uterus: Secondary | ICD-10-CM | POA: Diagnosis not present

## 2018-12-29 LAB — CMP (CANCER CENTER ONLY)
ALT: 18 U/L (ref 0–44)
AST: 20 U/L (ref 15–41)
Albumin: 4.3 g/dL (ref 3.5–5.0)
Alkaline Phosphatase: 58 U/L (ref 38–126)
Anion gap: 11 (ref 5–15)
BUN: 7 mg/dL — ABNORMAL LOW (ref 8–23)
CO2: 26 mmol/L (ref 22–32)
Calcium: 9.7 mg/dL (ref 8.9–10.3)
Chloride: 99 mmol/L (ref 98–111)
Creatinine: 0.7 mg/dL (ref 0.44–1.00)
GFR, Est AFR Am: >60 mL/min (ref >60)
GFR, Estimated: >60 mL/min (ref >60)
Glucose, Bld: 112 mg/dL — ABNORMAL HIGH (ref 70–99)
Potassium: 4.5 mmol/L (ref 3.5–5.1)
Sodium: 136 mmol/L (ref 135–145)
Total Bilirubin: 0.5 mg/dL (ref 0.3–1.2)
Total Protein: 6.9 g/dL (ref 6.5–8.1)

## 2018-12-29 LAB — CBC WITH DIFFERENTIAL (CANCER CENTER ONLY)
Abs Immature Granulocytes: 0.01 10*3/uL (ref 0.00–0.07)
Basophils Absolute: 0 10*3/uL (ref 0.0–0.1)
Basophils Relative: 0 %
Eosinophils Absolute: 0 10*3/uL (ref 0.0–0.5)
Eosinophils Relative: 1 %
HCT: 36.6 % (ref 36.0–46.0)
Hemoglobin: 12.4 g/dL (ref 12.0–15.0)
Immature Granulocytes: 0 %
Lymphocytes Relative: 26 %
Lymphs Abs: 1.3 10*3/uL (ref 0.7–4.0)
MCH: 32 pg (ref 26.0–34.0)
MCHC: 33.9 g/dL (ref 30.0–36.0)
MCV: 94.6 fL (ref 80.0–100.0)
Monocytes Absolute: 0.4 10*3/uL (ref 0.1–1.0)
Monocytes Relative: 8 %
Neutro Abs: 3 10*3/uL (ref 1.7–7.7)
Neutrophils Relative %: 65 %
Platelet Count: 269 10*3/uL (ref 150–400)
RBC: 3.87 MIL/uL (ref 3.87–5.11)
RDW: 13.3 % (ref 11.5–15.5)
WBC Count: 4.7 10*3/uL (ref 4.0–10.5)
nRBC: 0 % (ref 0.0–0.2)

## 2018-12-29 IMAGING — CT CT ABDOMEN AND PELVIS WITH CONTRAST
2 of 5 series · 16 of 46 positions shown, 18 images · IV contrast (omnipaque)
Comparison: None.

CLINICAL DATA: Follow-up endometrial carcinoma. Status post
hysterectomy and radiation therapy.

EXAM:
CT ABDOMEN AND PELVIS WITH CONTRAST
TECHNIQUE: Multidetector CT imaging of the abdomen and pelvis was performed
using the standard protocol following bolus administration of
intravenous contrast.
CONTRAST:  75mL OMNIPAQUE IOHEXOL 300 MG/ML  SOLN

[Series 2: axial st · axial · 0.67mm/px · z∈[-759,-404]mm · 13 of 83 slices shown, 15 images]
[im 6/83  soft-tissue]
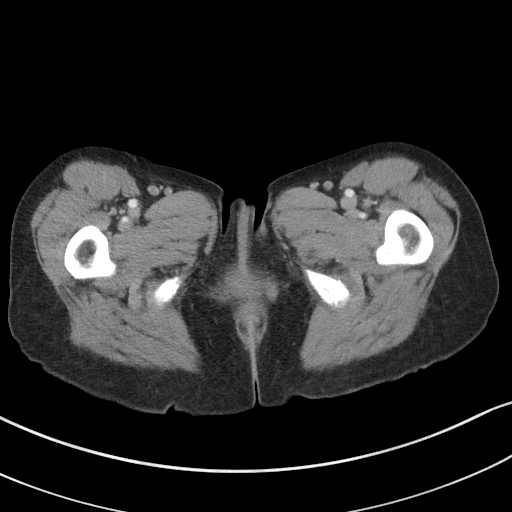
[im 6/83  bone]
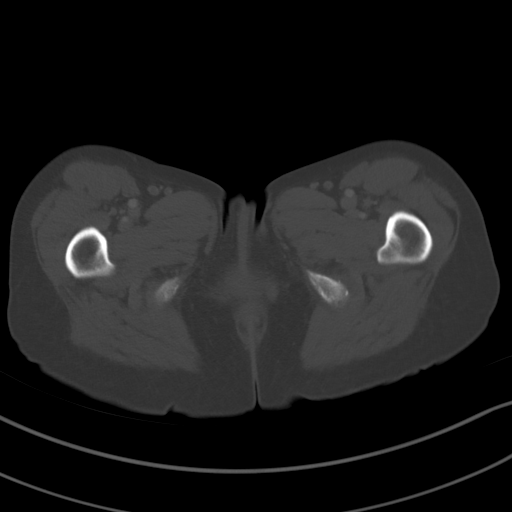
[im 11/83  soft-tissue]
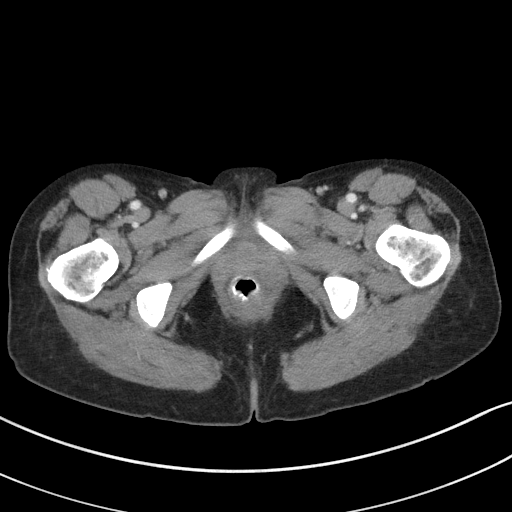
[im 17/83  soft-tissue]
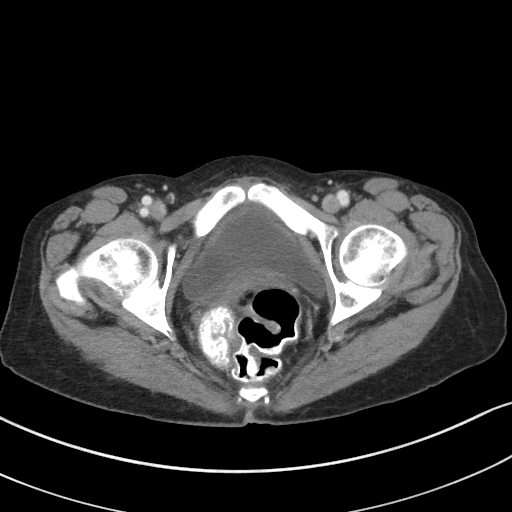
[im 22/83  soft-tissue]
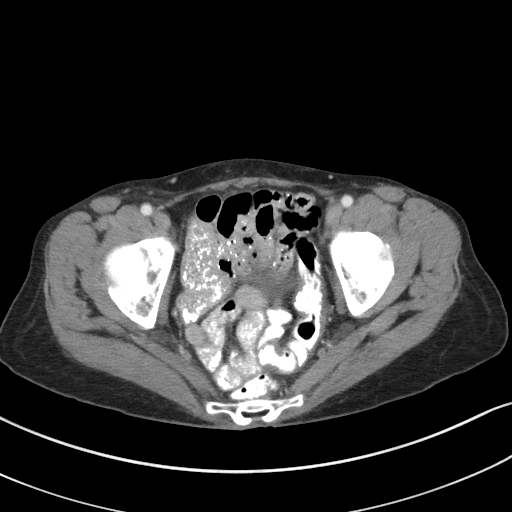
[im 28/83  soft-tissue]
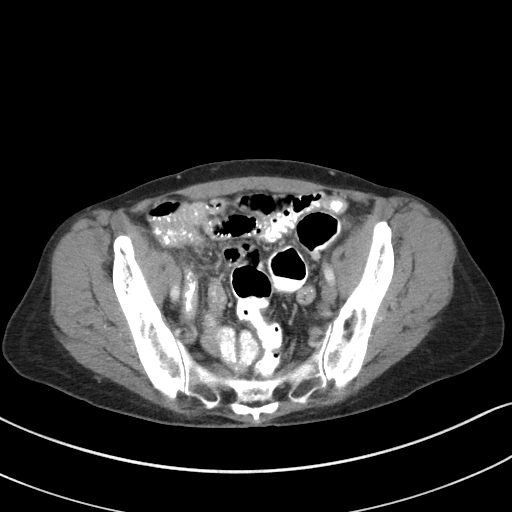
[im 33/83  soft-tissue]
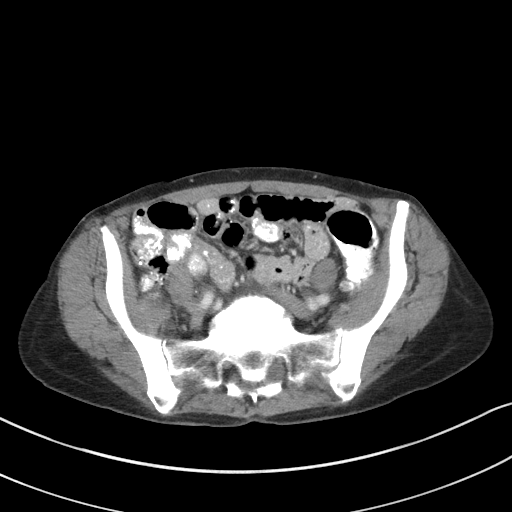
[im 44/83  soft-tissue]
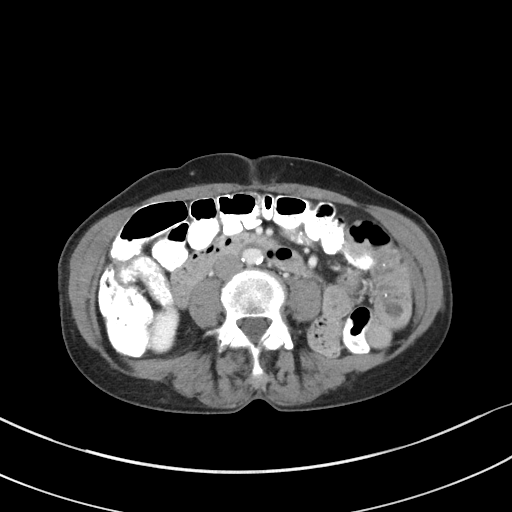
[im 50/83  soft-tissue]
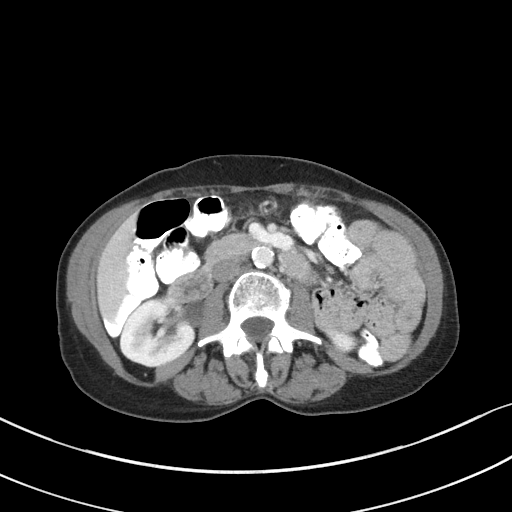
[im 55/83  soft-tissue]
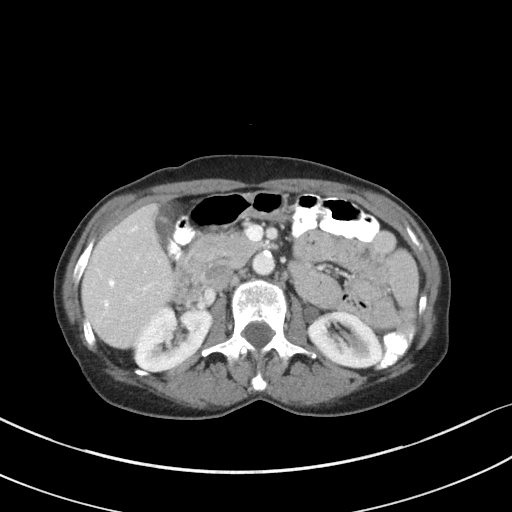
[im 55/83  bone]
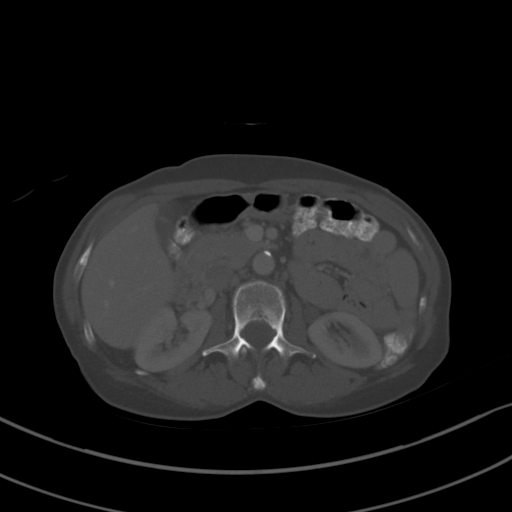
[im 61/83  soft-tissue]
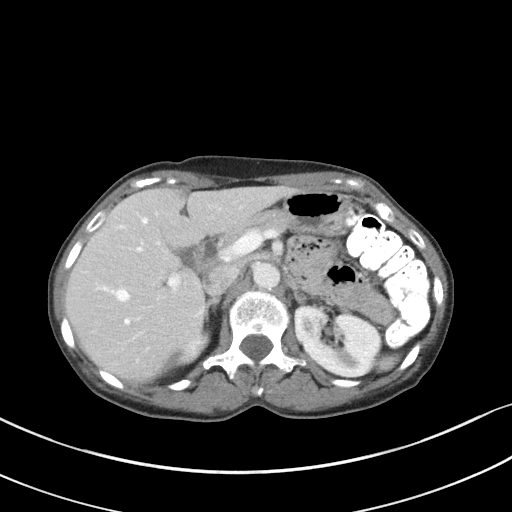
[im 66/83  soft-tissue]
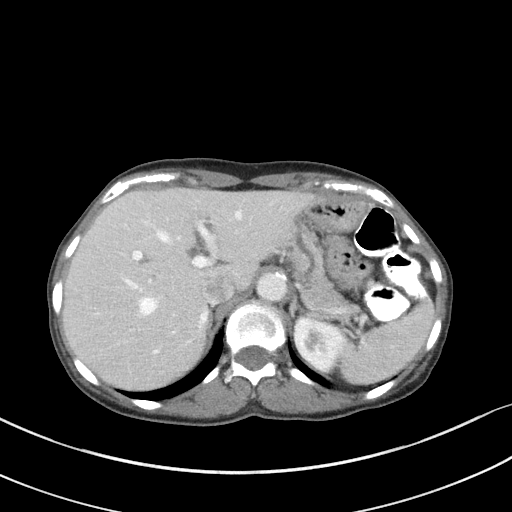
[im 72/83  soft-tissue]
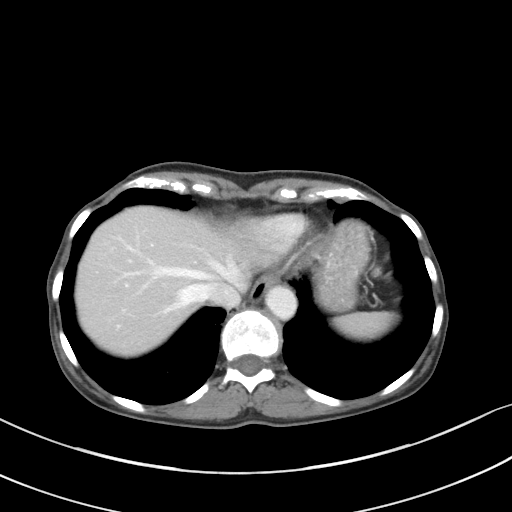
[im 77/83  soft-tissue]
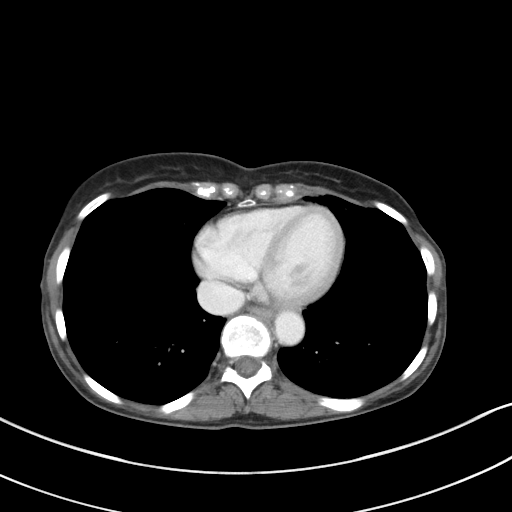

[Series 5: coronal st · coronal · 0.61mm/px · 3 of 65 slices shown]
[im 22/65  soft-tissue]
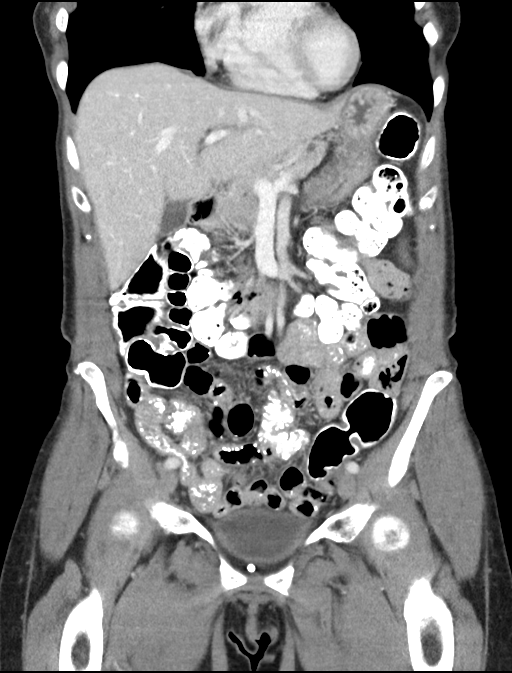
[im 29/65  soft-tissue]
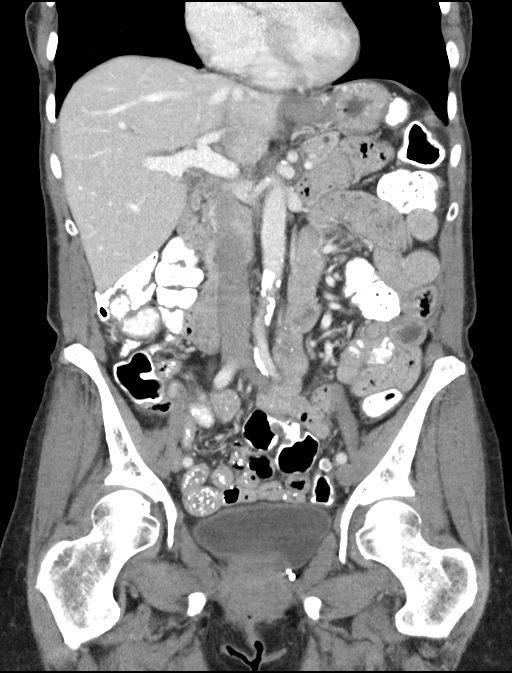
[im 36/65  soft-tissue]
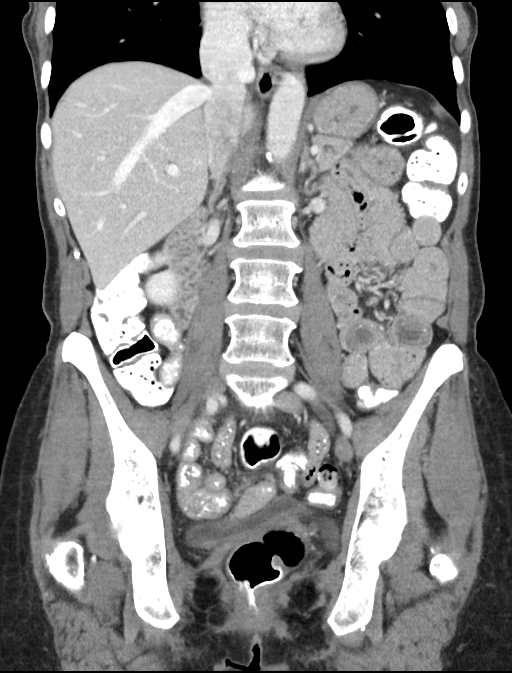

[16 of 46 positions shown; findings below may reference images not displayed]

FINDINGS: Lower Chest: No acute findings.

Hepatobiliary: No hepatic masses identified. Gallbladder is
unremarkable. No evidence of biliary ductal dilatation.

Pancreas: Mild pancreatic ductal dilatation is seen in the body and
tail, with transition point in the pancreatic body. No definite mass
visualized by CT. No evidence of peripancreatic inflammatory changes
or fluid collections.

Spleen: Within normal limits in size and appearance.

Adrenals/Urinary Tract: No masses identified. No evidence of
hydronephrosis. Unremarkable unopacified urinary bladder.

Stomach/Bowel: No evidence of obstruction, inflammatory process or
abnormal fluid collections.

Vascular/Lymphatic: No pathologically enlarged lymph nodes. No
abdominal aortic aneurysm. Aortic atherosclerosis.

Reproductive: Prior hysterectomy noted. No mass identified in the
surgical bed or elsewhere within the pelvis. Adnexal regions are
unremarkable in appearance.

Other:  None.

Musculoskeletal: No suspicious bone lesions identified. Bilateral L5
pars defects are seen with severe degenerative disc disease and
grade 1 anterolisthesis at L5-S1.
IMPRESSION: 1. Prior hysterectomy. No evidence of recurrent or metastatic
endometrium carcinoma.
2. Mild pancreatic ductal dilatation in body and tail, with
transition point in pancreatic body. No etiology visualized by CT.
Recommend abdomen MRI and MRCP without and with contrast to exclude
an occult pancreatic mass.

## 2018-12-29 MED ORDER — IOHEXOL 300 MG/ML  SOLN
75.0000 mL | Freq: Once | INTRAMUSCULAR | Status: AC | PRN
  Administered 2018-12-29: 75 mL via INTRAVENOUS

## 2018-12-29 MED ORDER — SODIUM CHLORIDE (PF) 0.9 % IJ SOLN
INTRAMUSCULAR | Status: AC
  Filled 2018-12-29: qty 50

## 2018-12-30 ENCOUNTER — Encounter: Payer: Self-pay | Admitting: Radiation Oncology

## 2018-12-30 ENCOUNTER — Ambulatory Visit
Admission: RE | Admit: 2018-12-30 | Discharge: 2018-12-30 | Disposition: A | Discharge status: Home / Self Care | Payer: Medicare Other | Source: Ambulatory Visit | Attending: Radiation Oncology | Admitting: Radiation Oncology

## 2018-12-30 ENCOUNTER — Other Ambulatory Visit: Payer: Self-pay

## 2018-12-30 VITALS — BP 126/75 | HR 82 | Temp 98.7°F | Resp 18 | Ht 62.0 in | Wt 96.0 lb

## 2018-12-30 DIAGNOSIS — M5137 Other intervertebral disc degeneration, lumbosacral region: Secondary | ICD-10-CM | POA: Insufficient documentation

## 2018-12-30 DIAGNOSIS — C541 Malignant neoplasm of endometrium: Secondary | ICD-10-CM

## 2018-12-30 DIAGNOSIS — Z7984 Long term (current) use of oral hypoglycemic drugs: Secondary | ICD-10-CM | POA: Diagnosis not present

## 2018-12-30 DIAGNOSIS — Z90710 Acquired absence of both cervix and uterus: Secondary | ICD-10-CM | POA: Insufficient documentation

## 2018-12-30 DIAGNOSIS — R933 Abnormal findings on diagnostic imaging of other parts of digestive tract: Secondary | ICD-10-CM | POA: Diagnosis not present

## 2018-12-30 DIAGNOSIS — Z79899 Other long term (current) drug therapy: Secondary | ICD-10-CM | POA: Diagnosis not present

## 2018-12-30 DIAGNOSIS — Z923 Personal history of irradiation: Secondary | ICD-10-CM | POA: Insufficient documentation

## 2018-12-30 DIAGNOSIS — K8689 Other specified diseases of pancreas: Secondary | ICD-10-CM | POA: Insufficient documentation

## 2018-12-30 DIAGNOSIS — Z8542 Personal history of malignant neoplasm of other parts of uterus: Secondary | ICD-10-CM | POA: Insufficient documentation

## 2018-12-30 DIAGNOSIS — Z08 Encounter for follow-up examination after completed treatment for malignant neoplasm: Secondary | ICD-10-CM | POA: Diagnosis not present

## 2018-12-30 NOTE — Patient Instructions (Signed)
Coronavirus (COVID-19) Are you at risk?  Are you at risk for the Coronavirus (COVID-19)?  To be considered HIGH RISK for Coronavirus (COVID-19), you have to meet the following criteria:  . Traveled to [place], [place], [place], [place] or [place]; or in the [place] to [place], [place], [place], or [place]; and have fever, cough, and shortness of breath within the last 2 weeks of travel OR . Been in close contact with a person diagnosed with COVID-19 within the last 2 weeks and have fever, cough, and shortness of breath . IF YOU DO NOT MEET THESE CRITERIA, YOU ARE CONSIDERED LOW RISK FOR COVID-19.  What to do if you are HIGH RISK for COVID-19?  . If you are having a medical emergency, call 911. . Seek medical care right away. Before you go to a doctor's office, urgent care or emergency department, call ahead and tell them about your recent travel, contact with someone diagnosed with COVID-19, and your symptoms. You should receive instructions from your physician's office regarding next steps of care.  . When you arrive at healthcare provider, tell the healthcare staff immediately you have returned from visiting [place], [place], [place], [place] or [place]; or traveled in the [place] to [place], [place], [place], or [place]; in the last two weeks or you have been in close contact with a person diagnosed with COVID-19 in the last 2 weeks.   . Tell the health care staff about your symptoms: fever, cough and shortness of breath. . After you have been seen by a medical provider, you will be either: o Tested for (COVID-19) and discharged home on quarantine except to seek medical care if symptoms worsen, and asked to  - Stay home and avoid contact with others until you get your results (4-5 days)  - Avoid travel on public transportation if possible (such as bus, train, or airplane) or o Sent to the Emergency Department by EMS for evaluation, COVID-19 testing, and possible  admission depending on your condition and test results.  What to do if you are LOW RISK for COVID-19?  Reduce your risk of any infection by using the same precautions used for avoiding the common cold or flu:  . Wash your hands often with soap and warm water for at least 20 seconds.  If soap and water are not readily available, use an alcohol-based hand sanitizer with at least 60% alcohol.  . If coughing or sneezing, cover your mouth and nose by coughing or sneezing into the elbow areas of your shirt or coat, into a tissue or into your sleeve (not your hands). . Avoid shaking hands with others and consider head nods or verbal greetings only. . Avoid touching your eyes, nose, or mouth with unwashed hands.  . Avoid close contact with people who are sick. . Avoid places or events with large numbers of people in one location, like concerts or sporting events. . Carefully consider travel plans you have or are making. . If you are planning any travel outside or inside the [place], visit the CDC's Travelers' Health webpage for the latest health notices. . If you have some symptoms but not all symptoms, continue to monitor at home and seek medical attention if your symptoms worsen. . If you are having a medical emergency, call 911.   ADDITIONAL HEALTHCARE OPTIONS FOR PATIENTS  [facility] / e-Visit: [email or web address]         [facility]: [phone]  [facility]: [phone]                   [facility]: [phone]   

## 2018-12-30 NOTE — Progress Notes (Signed)
Radiation Oncology         [phone] ________________________________  Name: Kimberly [name] MRN: [id number]  Date: 12/30/2018  DOB: 08-19-1950  Follow-Up Visit Note  CC: Marton Redwood, MD  Marton Redwood, MD    ICD-10-CM   1. Endometrial cancer determined by uterine biopsy ([place])  C54.1 MR Abdomen W Wo Contrast    MR ABDOMEN WITH MRCP W CONTRAST    Diagnosis:   68 y.o. 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 06/11/17-07/15/17 (external beam): Pelvis / 45 Gy in 25 fractions 2. 07/21/17-08/03/17 (brachytherapy): Vaginal cuff / 18 Gy in 3 fractions  Narrative:  The patient returns today for routine follow-up. She last saw Dr. Denman George on 10/15/2018.  She underwent abdomen/pelvis CT yesterday, 12/29/2018. This showed no evidence of recurrent or metastatic endometrium carcinoma. It also showed mild pancreatic ductal dilatation in body and tail, with transition point in pancreatic body.  On review of systems, she has no complaints today. She specifically denies pain, dysuria or hematuria, vaginal bleeding or discharge, rectal bleeding, diarrhea or constipation, abdominal bloating, and nausea or vomiting.               ALLERGIES:  is allergic to sulfur.  Meds: Current Outpatient Medications  Medication Sig Dispense Refill  . metFORMIN (GLUCOPHAGE) 1000 MG tablet Take 1,000 mg by mouth 2 (two) times daily with a meal.    . rosuvastatin (CRESTOR) 10 MG tablet Take 10 mg by mouth daily.    Marland Kitchen tretinoin (RETIN-A) 0.1 % cream APPLY A THIN LAYER TO FACE ONCE DAILY AT NIGHT    . triamcinolone cream (KENALOG) 0.1 %     . zolpidem (AMBIEN CR) 12.5 MG CR tablet Take 12.5 mg by mouth at bedtime as needed for sleep.     Current Facility-Administered Medications  Medication Dose Route Frequency  Provider Last Rate Last Dose  . 0.9 %  sodium chloride infusion  500 mL Intravenous Continuous Milus Banister, MD        Physical Findings: The patient is in no acute distress. Patient is alert and oriented.  height is 5\' 2"  (1.575 m) and weight is 96 lb (43.5 kg). Her temporal temperature is 98.7 F (37.1 C). Her blood pressure is 126/75 and her pulse is 82. Her respiration is 18 and oxygen saturation is 100%.   Lungs are clear to auscultation bilaterally. Heart has regular rate and rhythm. No palpable cervical, supraclavicular, or axillary adenopathy. Abdomen soft, non-tender, normal bowel sounds. On pelvic examination the external genitalia were unremarkable. A speculum exam was performed. There are no mucosal lesions noted in the vaginal vault. Vaginal cuff intact. On bimanual examination there were no pelvic masses appreciated. Rectal sphincter tone normal with no endorectal masses appreciated.  Lab Findings: Lab Results  Component Value Date   WBC 4.7 12/29/2018   HGB 12.4 12/29/2018   HCT 36.6 12/29/2018   MCV 94.6 12/29/2018   PLT 269 12/29/2018    Radiographic Findings: Ct Abdomen Pelvis W Contrast  Result Date: 12/29/2018 CLINICAL DATA:  Follow-up endometrial carcinoma. Status post hysterectomy and radiation therapy. EXAM: CT ABDOMEN AND PELVIS WITH CONTRAST TECHNIQUE: Multidetector CT imaging of the abdomen and pelvis was performed using the standard protocol following bolus administration of intravenous contrast. CONTRAST:  3mL OMNIPAQUE IOHEXOL 300 MG/ML  SOLN COMPARISON:  None. FINDINGS: Lower Chest: No acute findings. Hepatobiliary: No hepatic masses identified. Gallbladder is unremarkable. No evidence of biliary ductal dilatation. Pancreas: Mild pancreatic ductal dilatation is seen in the body and tail, with transition point in the pancreatic body. No definite mass visualized by CT. No evidence of peripancreatic inflammatory changes or fluid collections. Spleen: Within  normal limits in size and appearance. Adrenals/Urinary Tract: No masses identified. No evidence of hydronephrosis. Unremarkable unopacified urinary bladder. Stomach/Bowel: No evidence of obstruction, inflammatory process or abnormal fluid collections. Vascular/Lymphatic: No pathologically enlarged lymph nodes. No abdominal aortic aneurysm. Aortic atherosclerosis. Reproductive: Prior hysterectomy noted. No mass identified in the surgical bed or elsewhere within the pelvis. Adnexal regions are unremarkable in appearance. Other:  None. Musculoskeletal: No suspicious bone lesions identified. Bilateral L5 pars defects are seen with severe degenerative disc disease and grade 1 anterolisthesis at L5-S1. IMPRESSION: 1. Prior hysterectomy. No evidence of recurrent or metastatic endometrium carcinoma. 2. Mild pancreatic ductal dilatation in body and tail, with transition point in pancreatic body. No etiology visualized by CT. Recommend abdomen MRI and MRCP without and with contrast to exclude an occult pancreatic mass. Electronically Signed   By: Marlaine Hind M.D.   On: 12/29/2018 13:26    Impression:  Stage IB, grade 3 endometrioid adenocarcinoma of the endometrium, multifocal lymphovascular space invasion, isolated tumor cells within left external iliac lymph node chain. No evidence of recurrence on clinical exam and recent CT scan. Patient's CT scan did show pancreatic duct dilatation and additional imaging was recommended to rule out a small lesion in the pancreas. Patient will proceed with an MRI of the abdomen and possibly MRCP.   Plan:  Scans as above. Patient will follow up with Dr. Denman George in 3 months and return to radiation oncology in 6 months assuming scans are unremarkable.  ____________________________________  Blair Promise, PhD, MD  This document serves as a record of services personally performed by Gery Pray, MD. It was created on his behalf by Wilburn Mylar, a trained medical scribe. The  creation of this record is based on the scribe's personal observations and the provider's statements to them. This document has been checked and approved by the attending provider.

## 2018-12-30 NOTE — Progress Notes (Signed)
Pt presents today for f/u with Dr. Sondra Come. Pt denies c/o pain. Pt denies dysuria/hematuria. Pt denies vaginal bleeding/discharge. Pt denies rectal bleeding, diarrhea/constipation. Pt denies abdominal bloating, N/V.  BP 126/75 (BP Location: Left Arm, Patient Position: Sitting)   Pulse 82   Temp 98.7 F (37.1 C) (Temporal)   Resp 18   Ht 5\' 2"  (1.575 m)   Wt 96 lb (43.5 kg)   SpO2 100%   BMI 17.56 kg/m   Wt Readings from Last 3 Encounters:  12/30/18 96 lb (43.5 kg)  10/15/18 101 lb 6.4 oz (46 kg)  07/01/18 97 lb (44 kg)   Loma Sousa, RN BSN

## 2019-01-05 DIAGNOSIS — H40023 Open angle with borderline findings, high risk, bilateral: Secondary | ICD-10-CM | POA: Diagnosis not present

## 2019-01-05 DIAGNOSIS — Z961 Presence of intraocular lens: Secondary | ICD-10-CM | POA: Diagnosis not present

## 2019-01-05 DIAGNOSIS — H401121 Primary open-angle glaucoma, left eye, mild stage: Secondary | ICD-10-CM | POA: Diagnosis not present

## 2019-01-05 DIAGNOSIS — H25811 Combined forms of age-related cataract, right eye: Secondary | ICD-10-CM | POA: Diagnosis not present

## 2019-01-05 DIAGNOSIS — H401112 Primary open-angle glaucoma, right eye, moderate stage: Secondary | ICD-10-CM | POA: Diagnosis not present

## 2019-01-13 ENCOUNTER — Telehealth: Payer: Self-pay | Admitting: *Deleted

## 2019-01-13 ENCOUNTER — Other Ambulatory Visit: Payer: Self-pay

## 2019-01-13 DIAGNOSIS — C541 Malignant neoplasm of endometrium: Secondary | ICD-10-CM

## 2019-01-13 NOTE — Telephone Encounter (Signed)
CALLED PATIENT TO INFORM OF MRI FOR 01-20-19 - ARRIVAL TIME- 6:45 AM , PT. TO BE NPO- 4 HRS. PRIOR TO TEST, TEST TO BE AT [facility], SPOKE WITH PATIENT AND SHE IS AWARE OF THIS TEST

## 2019-01-20 ENCOUNTER — Other Ambulatory Visit: Payer: Self-pay

## 2019-01-20 ENCOUNTER — Other Ambulatory Visit: Payer: Self-pay | Admitting: Radiation Oncology

## 2019-01-20 ENCOUNTER — Ambulatory Visit (HOSPITAL_COMMUNITY)
Admission: RE | Admit: 2019-01-20 | Discharge: 2019-01-20 | Disposition: A | Discharge status: Home / Self Care | Payer: Medicare Other | Source: Ambulatory Visit | Attending: Radiation Oncology | Admitting: Radiation Oncology

## 2019-01-20 DIAGNOSIS — K8689 Other specified diseases of pancreas: Secondary | ICD-10-CM | POA: Diagnosis not present

## 2019-01-20 DIAGNOSIS — C541 Malignant neoplasm of endometrium: Secondary | ICD-10-CM

## 2019-01-20 IMAGING — MR MR 3D RECON AT SCANNER
19 of 23 series · 19 of 23 positions shown · IV contrast (gadavist)
Comparison: CT [DATE]

CLINICAL DATA: Endometrial carcinoma. Ductal dilatation in the
pancreas identified on CT exam.



[Series 3: T2 fat-sat · axial · 5.0mm · 0.86mm/px · 1 of 49 slices shown]
[im 1/49]
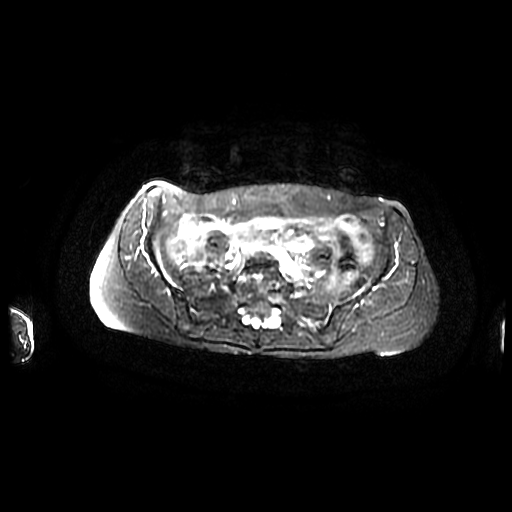

[Series 4: MRCP · coronal · 1.6mm · 0.62mm/px · 1 of 137 slices shown (1 of 2)]
[im 1/137]
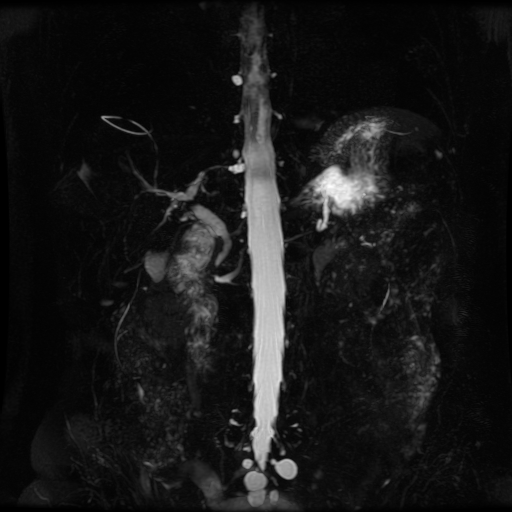

[Series 6: DWI b500 · axial · 6.0mm · 1.76mm/px · 1 of 60 slices shown]
[im 1/60]
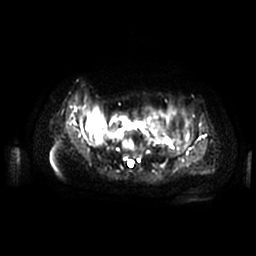

[Series 7: bSSFP fat-sat · coronal · 5.0mm · 0.78mm/px · 1 of 34 slices shown]
[im 1/34]
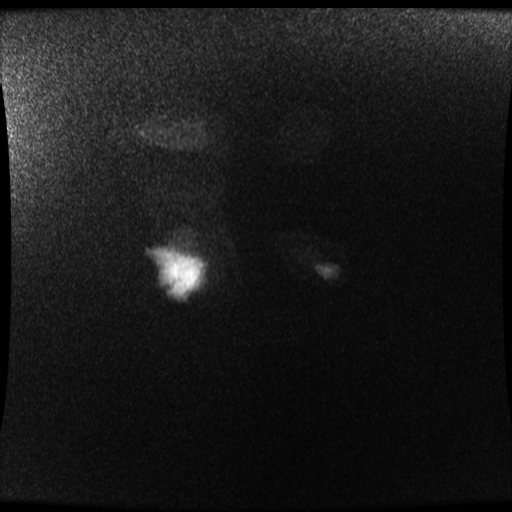

[Series 8: T2 · axial · 5.0mm · 0.86mm/px · 1 of 54 slices shown]
[im 1/54]
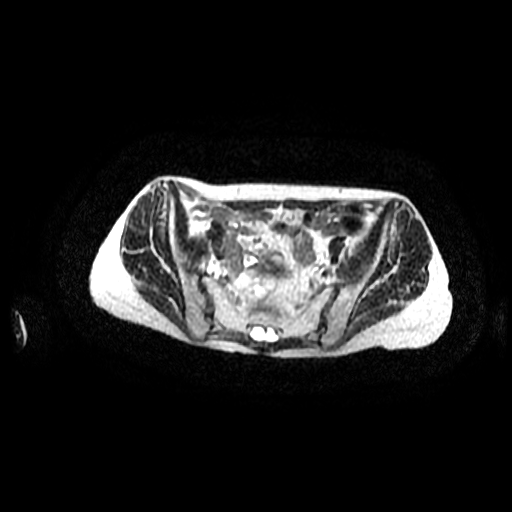

[Series 9: ax dualecho bh · axial · 5.0mm · 0.86mm/px · 1 of 100 slices shown]
[im 1/100]
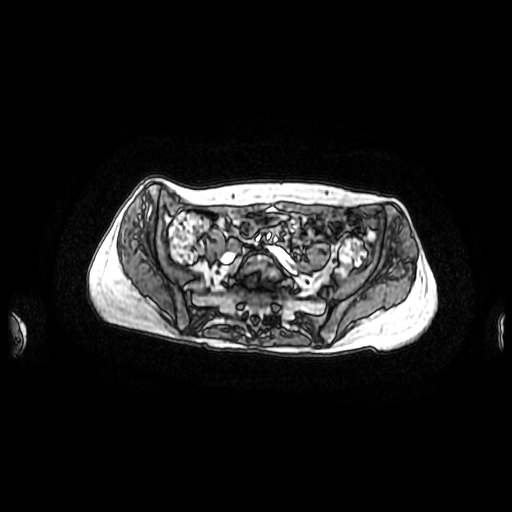

[Series 10: MRCP · coronal · 40.0mm · 0.70mm/px · 1 of 7 slices shown (2 of 2)]
[im 1/7]
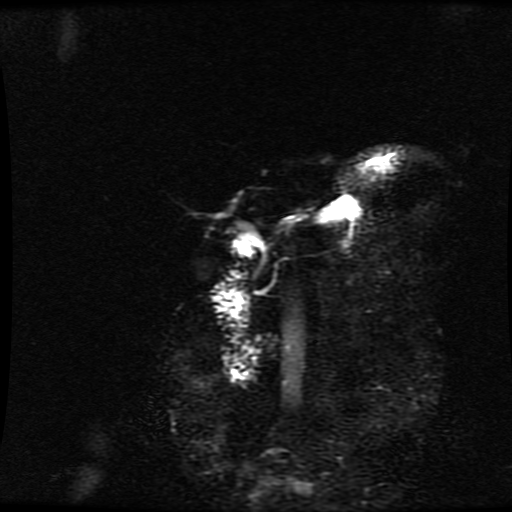

[Series 400: reformatted · axial · 1.6mm · 0.62mm/px · 1 of 146 slices shown (1 of 2)]
[im 1/146]
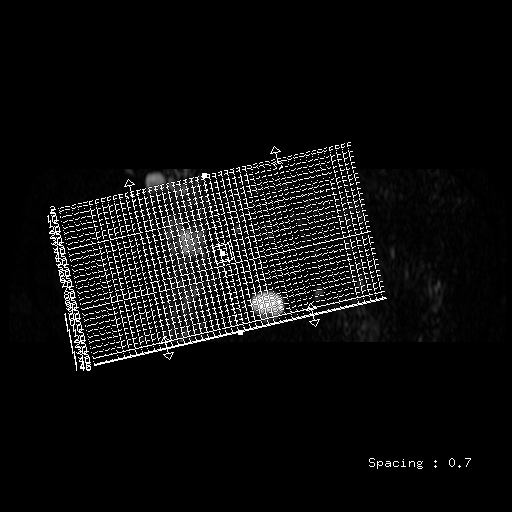

[Series 401: reformatted · coronal · 100.0mm · 0.51mm/px · 1 of 157 slices shown (2 of 2)]
[im 1/157]
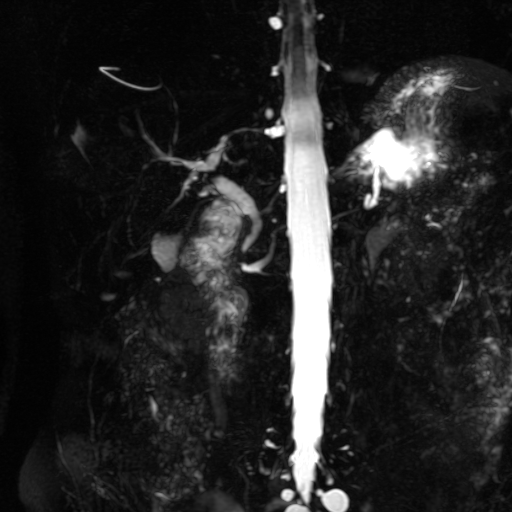

[Series 600: DWI · axial · 6.0mm · 1.76mm/px · 1 of 30 slices shown]
[im 1/30]
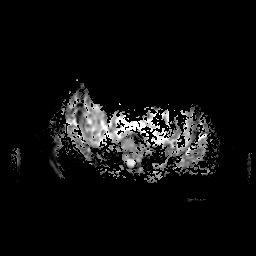

[Series 1100: T1 dynamic · axial · 6.0mm · 0.78mm/px · 1 of 88 slices shown (1 of 5)]
[im 1/88]
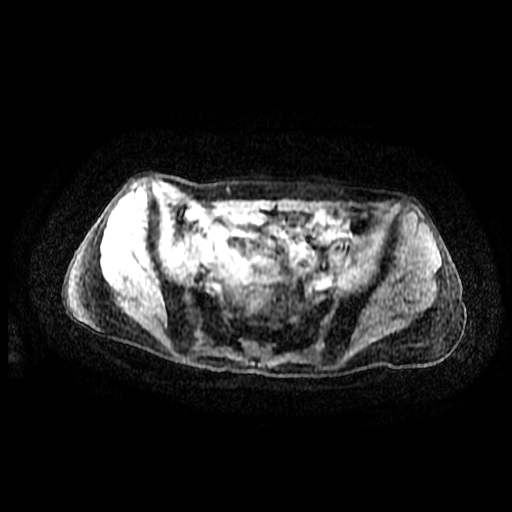

[Series 1101: T1 dynamic · axial · 6.0mm · 0.78mm/px · 1 of 88 slices shown (2 of 5)]
[im 1/88]
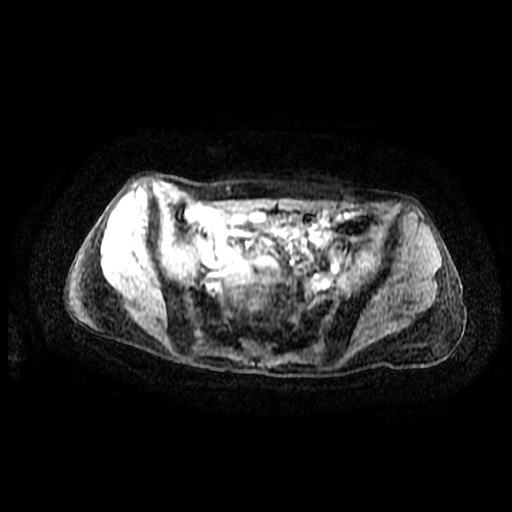

[Series 1103: T1 dynamic · axial · 6.0mm · 0.78mm/px · 1 of 88 slices shown (3 of 5)]
[im 1/88]
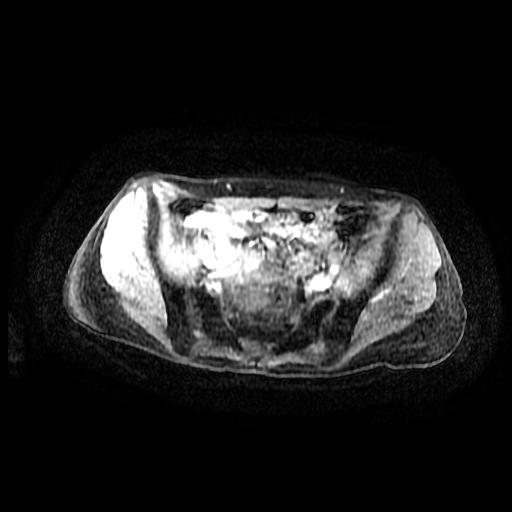

[Series 1104: T1 dynamic · axial · 6.0mm · 0.78mm/px · 1 of 88 slices shown (4 of 5)]
[im 1/88]
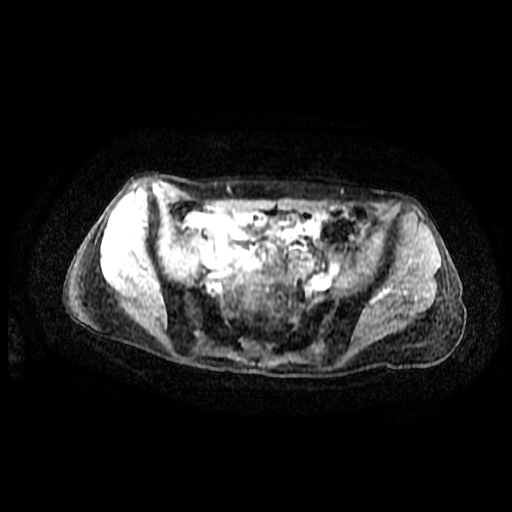

[Series 1105: T1 dynamic · axial · 6.0mm · 0.78mm/px · 1 of 88 slices shown (5 of 5)]
[im 1/88]
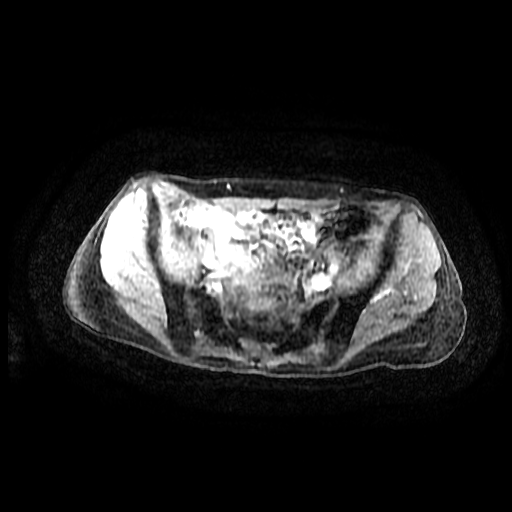

[((id)/(id)/88)-((id)/(id)/88) · axial · 6.0mm · 0.78mm/px · 1 of 88 slices shown (1 of 4)]
[im 1/88]
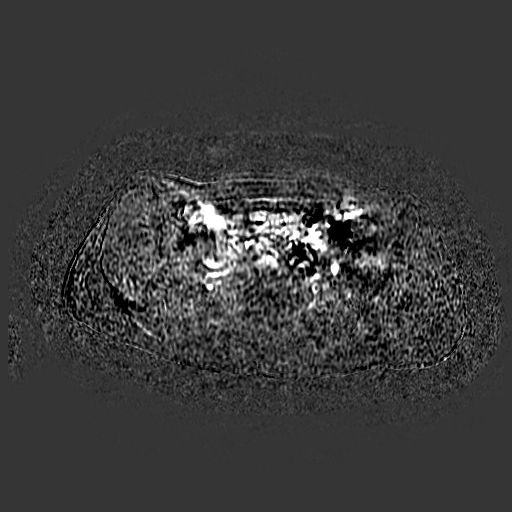

[((id)/(id)/88)-((id)/(id)/88) · axial · 6.0mm · 0.78mm/px · 1 of 88 slices shown (2 of 4)]
[im 1/88]
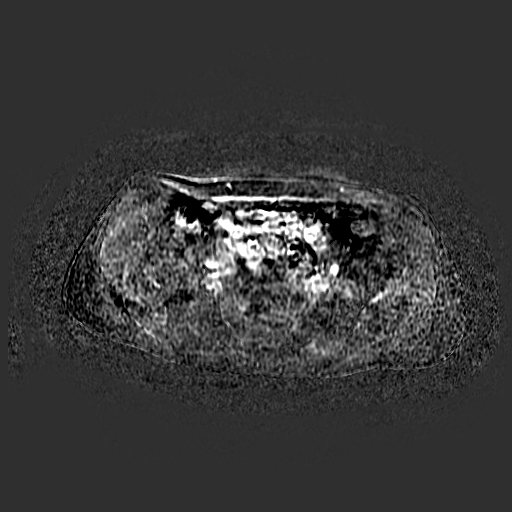

[((id)/(id)/88)-((id)/(id)/88) · axial · 6.0mm · 0.78mm/px · 1 of 88 slices shown (3 of 4)]
[im 1/88]
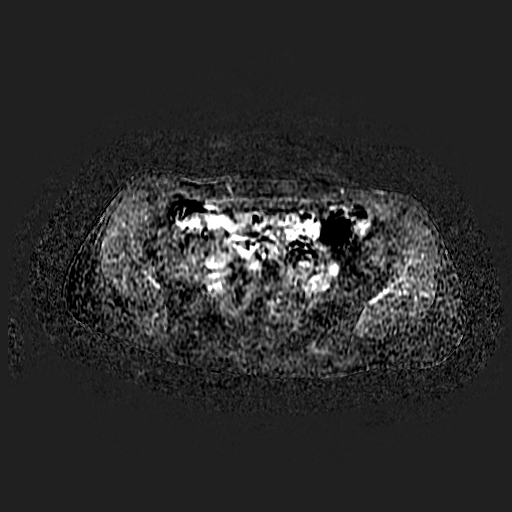

[((id)/(id)/88)-((id)/(id)/88) · axial · 6.0mm · 0.78mm/px · 1 of 88 slices shown (4 of 4)]
[im 1/88]
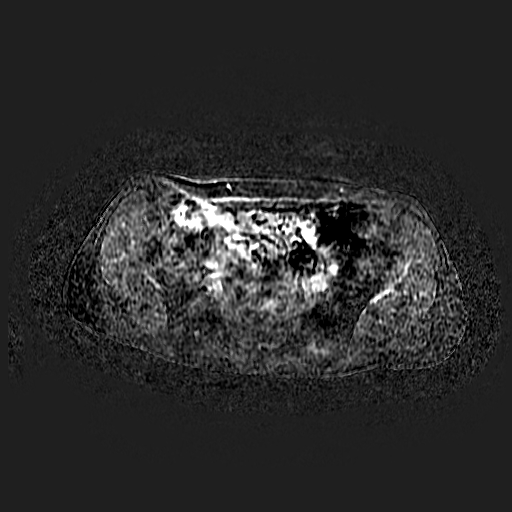

[19 of 23 positions shown; findings below may reference images not displayed]

FINDINGS: Lower chest:  Lung bases are clear.

Hepatobiliary: No focal hepatic lesion. No biliary duct dilatation.
Common hepatic duct is dilated to 9 mm. There is a transition to
normal caliber common bile duct at 5 mm (image 55/400) as the duct
enters the pancreatic head. No lesion identified. Gallbladder
normal.

Pancreas: Within the mid body of the pancreas there is a caliber
change of the pancreatic duct on T2 weighted imaging image [DATE].
Proximal to this level the pancreatic duct measures 3 mm (image
[DATE]) and distal to this level the pancreatic duct measures 1 mm.
There is a short gap ductal in definition at this level of the
caliber change.

On the precontrast T1 weighted imaging, there is a subtle
hypointense lesion at this level measuring 6 mm x 4 mm (image
33/88). This lesion may also subtly be appreciated on the opposed
phase imaging (image [DATE]). Postcontrast enhanced imaging
demonstrates a gap in the duct at this level with no clear enhancing
lesion (image 33/[CM]).

Spleen: Normal spleen.

Adrenals/urinary tract: Adrenal glands and kidneys are normal.

Stomach/Bowel: Stomach and limited of the small bowel is
unremarkable

Vascular/Lymphatic: Abdominal aortic normal caliber. No
retroperitoneal periportal lymphadenopathy.

Musculoskeletal: No aggressive osseous lesion
IMPRESSION: 1. Pancreatic duct caliber change in the mid pancreatic body with a
subtle lesion identified. Findings concerning for a small PANCREATIC
NEOPLASM obstructing the duct. Recommend EUS evaluation and
potential sampling of this small lesion.
2. Normal liver.
3. Normal biliary tree.

These results will be called to the ordering clinician or
representative by the Radiologist Assistant, and communication
documented in the PACS or zVision Dashboard.

## 2019-01-20 MED ORDER — GADOBUTROL 1 MMOL/ML IV SOLN
5.0000 mL | Freq: Once | INTRAVENOUS | Status: AC | PRN
  Administered 2019-01-20: 08:00:00 5 mL via INTRAVENOUS

## 2019-01-24 ENCOUNTER — Other Ambulatory Visit: Payer: Self-pay

## 2019-01-24 ENCOUNTER — Encounter: Payer: Self-pay | Admitting: Radiation Oncology

## 2019-01-24 ENCOUNTER — Ambulatory Visit
Admission: RE | Admit: 2019-01-24 | Discharge: 2019-01-24 | Disposition: A | Discharge status: Home / Self Care | Payer: Medicare Other | Source: Ambulatory Visit | Attending: Radiation Oncology | Admitting: Radiation Oncology

## 2019-01-24 VITALS — BP 131/91 | HR 99 | Temp 98.5°F | Resp 16 | Wt 96.6 lb

## 2019-01-24 DIAGNOSIS — Z08 Encounter for follow-up examination after completed treatment for malignant neoplasm: Secondary | ICD-10-CM | POA: Diagnosis not present

## 2019-01-24 DIAGNOSIS — E119 Type 2 diabetes mellitus without complications: Secondary | ICD-10-CM | POA: Insufficient documentation

## 2019-01-24 DIAGNOSIS — C541 Malignant neoplasm of endometrium: Secondary | ICD-10-CM

## 2019-01-24 DIAGNOSIS — Z79899 Other long term (current) drug therapy: Secondary | ICD-10-CM | POA: Diagnosis not present

## 2019-01-24 DIAGNOSIS — N898 Other specified noninflammatory disorders of vagina: Secondary | ICD-10-CM | POA: Diagnosis not present

## 2019-01-24 DIAGNOSIS — Z923 Personal history of irradiation: Secondary | ICD-10-CM | POA: Diagnosis not present

## 2019-01-24 DIAGNOSIS — Z8542 Personal history of malignant neoplasm of other parts of uterus: Secondary | ICD-10-CM | POA: Diagnosis not present

## 2019-01-24 DIAGNOSIS — I700 Atherosclerosis of aorta: Secondary | ICD-10-CM | POA: Diagnosis not present

## 2019-01-24 DIAGNOSIS — Z7984 Long term (current) use of oral hypoglycemic drugs: Secondary | ICD-10-CM | POA: Diagnosis not present

## 2019-01-24 DIAGNOSIS — K869 Disease of pancreas, unspecified: Secondary | ICD-10-CM | POA: Diagnosis not present

## 2019-01-24 NOTE — Progress Notes (Addendum)
Pt presents today for f/u with Dr. Sondra Come to review MRI results. Pt plans to facetime daughter during appt.   Pt reports intermittent scant vaginal discharge, tan in color.   BP (!) 131/91 (BP Location: Left Arm, Patient Position: Sitting)   Pulse 99   Temp 98.5 F (36.9 C) (Temporal)   Resp 16   Wt 96 lb 9.6 oz (43.8 kg)   SpO2 97%   BMI 17.67 kg/m   Wt Readings from Last 3 Encounters:  01/24/19 96 lb 9.6 oz (43.8 kg)  12/30/18 96 lb (43.5 kg)  10/15/18 101 lb 6.4 oz (46 kg)   Loma Sousa, RN BSN

## 2019-01-24 NOTE — Patient Instructions (Signed)
Coronavirus (COVID-19) Are you at risk?  Are you at risk for the Coronavirus (COVID-19)?  To be considered HIGH RISK for Coronavirus (COVID-19), you have to meet the following criteria:  . Traveled to [place], [place], [place], [place] or [place]; or in the [place] to [place], [place], [place], or [place]; and have fever, cough, and shortness of breath within the last 2 weeks of travel OR . Been in close contact with a person diagnosed with COVID-19 within the last 2 weeks and have fever, cough, and shortness of breath . IF YOU DO NOT MEET THESE CRITERIA, YOU ARE CONSIDERED LOW RISK FOR COVID-19.  What to do if you are HIGH RISK for COVID-19?  . If you are having a medical emergency, call 911. . Seek medical care right away. Before you go to a doctor's office, urgent care or emergency department, call ahead and tell them about your recent travel, contact with someone diagnosed with COVID-19, and your symptoms. You should receive instructions from your physician's office regarding next steps of care.  . When you arrive at healthcare provider, tell the healthcare staff immediately you have returned from visiting [place], [place], [place], [place] or [place]; or traveled in the [place] to [place], [place], [place], or [place]; in the last two weeks or you have been in close contact with a person diagnosed with COVID-19 in the last 2 weeks.   . Tell the health care staff about your symptoms: fever, cough and shortness of breath. . After you have been seen by a medical provider, you will be either: o Tested for (COVID-19) and discharged home on quarantine except to seek medical care if symptoms worsen, and asked to  - Stay home and avoid contact with others until you get your results (4-5 days)  - Avoid travel on public transportation if possible (such as bus, train, or airplane) or o Sent to the Emergency Department by EMS for evaluation, COVID-19 testing, and possible  admission depending on your condition and test results.  What to do if you are LOW RISK for COVID-19?  Reduce your risk of any infection by using the same precautions used for avoiding the common cold or flu:  . Wash your hands often with soap and warm water for at least 20 seconds.  If soap and water are not readily available, use an alcohol-based hand sanitizer with at least 60% alcohol.  . If coughing or sneezing, cover your mouth and nose by coughing or sneezing into the elbow areas of your shirt or coat, into a tissue or into your sleeve (not your hands). . Avoid shaking hands with others and consider head nods or verbal greetings only. . Avoid touching your eyes, nose, or mouth with unwashed hands.  . Avoid close contact with people who are sick. . Avoid places or events with large numbers of people in one location, like concerts or sporting events. . Carefully consider travel plans you have or are making. . If you are planning any travel outside or inside the [place], visit the CDC's Travelers' Health webpage for the latest health notices. . If you have some symptoms but not all symptoms, continue to monitor at home and seek medical attention if your symptoms worsen. . If you are having a medical emergency, call 911.   ADDITIONAL HEALTHCARE OPTIONS FOR PATIENTS  [facility] / e-Visit: [email or web address]         [facility]: [phone]  [facility]: [phone]                   [facility]: [phone]   

## 2019-01-24 NOTE — Progress Notes (Addendum)
Radiation Oncology         [phone] ________________________________  Name: Kimberly [name] MRN: [id number]  Date: 01/24/2019  DOB: [birth year]  Follow-Up Visit Note  CC: Marton Redwood, MD  Marton Redwood, MD    ICD-10-CM   1. Endometrial cancer determined by uterine biopsy (HCC)  C54.1     Diagnosis:   68 y.o. 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 06/11/17 - 07/15/17 (external beam): Pelvis / 45 Gy in 25 fractions 2. 07/21/17 - 08/03/17 (brachytherapy): Vaginal cuff / 18 Gy in 3 fractions  Narrative:  The patient returns today to review her recent scans. She is accompanied by her daughter via FaceTime. She last saw Dr. Denman George on 10/15/2018.  She underwent abdomen MRI with MRCP on 01/20/2019. This showed: pancreatic duct caliber change in the mid pancreatic body with a subtle lesion identified, concerning for a small pancreatic neoplasm obstructing the duct.  On review of systems, she reports intermittent scant tan-colored vaginal discharge.              ALLERGIES:  is allergic to sulfur.  Meds: Current Outpatient Medications  Medication Sig Dispense Refill   latanoprost (XALATAN) 0.005 % ophthalmic solution INSTILL 1 DROP INTO EACH EYE NIGHTLY     metFORMIN (GLUCOPHAGE) 1000 MG tablet Take 1,000 mg by mouth 2 (two) times daily with a meal.     rosuvastatin (CRESTOR) 10 MG tablet Take 10 mg by mouth daily.     tretinoin (RETIN-A) 0.1 % cream APPLY A THIN LAYER TO FACE ONCE DAILY AT NIGHT     triamcinolone cream (KENALOG) 0.1 %      zolpidem (AMBIEN CR) 12.5 MG CR tablet Take 12.5 mg by mouth at bedtime as needed for sleep.     Current Facility-Administered Medications  Medication Dose Route Frequency Provider Last Rate Last Dose   0.9 %  sodium chloride infusion   500 mL Intravenous Continuous Milus Banister, MD        Physical Findings: The patient is in no acute distress. Patient is alert and oriented.  weight is 96 lb 9.6 oz (43.8 kg). Her temporal temperature is 98.5 F (36.9 C). Her blood pressure is 131/91 (abnormal) and her pulse is 99. Her respiration is 16 and oxygen saturation is 97%.    Lab Findings: Lab Results  Component Value Date   WBC 4.7 12/29/2018   HGB 12.4 12/29/2018   HCT 36.6 12/29/2018   MCV 94.6 12/29/2018   PLT 269 12/29/2018    Radiographic Findings: Ct Abdomen Pelvis W Contrast  Result Date: 12/29/2018 CLINICAL DATA:  Follow-up endometrial carcinoma. Status post hysterectomy and radiation therapy. EXAM: CT ABDOMEN AND PELVIS WITH CONTRAST TECHNIQUE: Multidetector CT imaging of the abdomen and pelvis was performed using the standard protocol following bolus administration of intravenous contrast. CONTRAST:  38mL OMNIPAQUE IOHEXOL 300 MG/ML  SOLN COMPARISON:  None. FINDINGS: Lower Chest: No acute findings. Hepatobiliary: No hepatic masses identified. Gallbladder is unremarkable. No evidence of biliary ductal dilatation. Pancreas: Mild pancreatic ductal dilatation is seen in the body and tail, with transition point in the pancreatic body. No definite mass visualized by CT. No evidence of peripancreatic inflammatory changes or fluid collections. Spleen: Within normal limits in size and appearance. Adrenals/Urinary Tract: No masses identified. No evidence of hydronephrosis. Unremarkable unopacified urinary  bladder. Stomach/Bowel: No evidence of obstruction, inflammatory process or abnormal fluid collections. Vascular/Lymphatic: No pathologically enlarged lymph nodes. No abdominal aortic aneurysm. Aortic atherosclerosis. Reproductive: Prior hysterectomy noted. No mass identified in the surgical bed or elsewhere within the pelvis. Adnexal regions are unremarkable in appearance. Other:  None. Musculoskeletal: No suspicious bone  lesions identified. Bilateral L5 pars defects are seen with severe degenerative disc disease and grade 1 anterolisthesis at L5-S1. IMPRESSION: 1. Prior hysterectomy. No evidence of recurrent or metastatic endometrium carcinoma. 2. Mild pancreatic ductal dilatation in body and tail, with transition point in pancreatic body. No etiology visualized by CT. Recommend abdomen MRI and MRCP without and with contrast to exclude an occult pancreatic mass. Electronically Signed   By: Marlaine Hind M.D.   On: 12/29/2018 13:26   Mr 3d Recon At Scanner  Result Date: 01/20/2019 CLINICAL DATA:  Endometrial carcinoma. Ductal dilatation in the pancreas identified on CT exam. EXAM: MRI ABDOMEN WITHOUT AND WITH CONTRAST (INCLUDING MRCP) TECHNIQUE: Multiplanar multisequence MR imaging of the abdomen was performed both before and after the administration of intravenous contrast. Heavily T2-weighted images of the biliary and pancreatic ducts were obtained, and three-dimensional MRCP images were rendered by post processing. CONTRAST:  71mL GADAVIST GADOBUTROL 1 MMOL/ML IV SOLN COMPARISON:  CT 12/29/2018 FINDINGS: Lower chest:  Lung bases are clear. Hepatobiliary: No focal hepatic lesion. No biliary duct dilatation. Common hepatic duct is dilated to 9 mm. There is a transition to normal caliber common bile duct at 5 mm (image 55/400) as the duct enters the pancreatic head. No lesion identified. Gallbladder normal. Pancreas: Within the mid body of the pancreas there is a caliber change of the pancreatic duct on T2 weighted imaging image 18/3. Proximal to this level the pancreatic duct measures 3 mm (image 14/3) and distal to this level the pancreatic duct measures 1 mm. There is a short gap ductal in definition at this level of the caliber change. On the precontrast T1 weighted imaging, there is a subtle hypointense lesion at this level measuring 6 mm x 4 mm (image 33/88). This lesion may also subtly be appreciated on the opposed phase  imaging (image 22/9). Postcontrast enhanced imaging demonstrates a gap in the duct at this level with no clear enhancing lesion (image 33/1101). Spleen: Normal spleen. Adrenals/urinary tract: Adrenal glands and kidneys are normal. Stomach/Bowel: Stomach and limited of the small bowel is unremarkable Vascular/Lymphatic: Abdominal aortic normal caliber. No retroperitoneal periportal lymphadenopathy. Musculoskeletal: No aggressive osseous lesion IMPRESSION: 1. Pancreatic duct caliber change in the mid pancreatic body with a subtle lesion identified. Findings concerning for a small PANCREATIC NEOPLASM obstructing the duct. Recommend EUS evaluation and potential sampling of this small lesion. 2. Normal liver. 3. Normal biliary tree. These results will be called to the ordering clinician or representative by the Radiologist Assistant, and communication documented in the PACS or zVision Dashboard. Electronically Signed   By: Suzy Bouchard M.D.   On: 01/20/2019 08:53   Mr Abdomen Mrcp Moise Boring Contast  Result Date: 01/20/2019 CLINICAL DATA:  Endometrial carcinoma. Ductal dilatation in the pancreas identified on CT exam. EXAM: MRI ABDOMEN WITHOUT AND WITH CONTRAST (INCLUDING MRCP) TECHNIQUE: Multiplanar multisequence MR imaging of the abdomen was performed both before and after the administration of intravenous contrast. Heavily T2-weighted images of the biliary and pancreatic ducts were obtained, and three-dimensional MRCP images were rendered by post processing. CONTRAST:  63mL GADAVIST GADOBUTROL 1 MMOL/ML IV SOLN COMPARISON:  CT 12/29/2018 FINDINGS: Lower chest:  Lung bases are  clear. Hepatobiliary: No focal hepatic lesion. No biliary duct dilatation. Common hepatic duct is dilated to 9 mm. There is a transition to normal caliber common bile duct at 5 mm (image 55/400) as the duct enters the pancreatic head. No lesion identified. Gallbladder normal. Pancreas: Within the mid body of the pancreas there is a caliber  change of the pancreatic duct on T2 weighted imaging image 18/3. Proximal to this level the pancreatic duct measures 3 mm (image 14/3) and distal to this level the pancreatic duct measures 1 mm. There is a short gap ductal in definition at this level of the caliber change. On the precontrast T1 weighted imaging, there is a subtle hypointense lesion at this level measuring 6 mm x 4 mm (image 33/88). This lesion may also subtly be appreciated on the opposed phase imaging (image 22/9). Postcontrast enhanced imaging demonstrates a gap in the duct at this level with no clear enhancing lesion (image 33/1101). Spleen: Normal spleen. Adrenals/urinary tract: Adrenal glands and kidneys are normal. Stomach/Bowel: Stomach and limited of the small bowel is unremarkable Vascular/Lymphatic: Abdominal aortic normal caliber. No retroperitoneal periportal lymphadenopathy. Musculoskeletal: No aggressive osseous lesion IMPRESSION: 1. Pancreatic duct caliber change in the mid pancreatic body with a subtle lesion identified. Findings concerning for a small PANCREATIC NEOPLASM obstructing the duct. Recommend EUS evaluation and potential sampling of this small lesion. 2. Normal liver. 3. Normal biliary tree. These results will be called to the ordering clinician or representative by the Radiologist Assistant, and communication documented in the PACS or zVision Dashboard. Electronically Signed   By: Suzy Bouchard M.D.   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  Recent MRI of the abdomen  confirms questionable pancreatic mass which has resulted in ductal dilatation. Further workup is indicated to rule out pancreatic neoplasm. The patient does have a diagnosis of diabetes mellitus. She denies a prior history of pancreatitis. Radiology recommends ERCP.   Plan:   Patient will be set up for consultation with Owens Loffler in gastroenterology for further evaluation. Patient is to keep her already-scheduled follow ups with myself and Dr. Denman George for her endometrial cancer.  ____________________________________  Blair Promise, PhD, MD  This document serves as a record of services personally performed by Gery Pray, MD. It was created on his behalf by Wilburn Mylar, a trained medical scribe. The creation of this record is based on the scribe's personal observations and the provider's statements to them. This document has been checked and approved by the attending provider.

## 2019-01-24 NOTE — H&P (View-Only) (Signed)
Radiation Oncology         [phone]) [phone] ________________________________  Name: Kimberly [name] MRN: [id number]  Date: 01/24/2019  DOB: 1950-09-16  Follow-Up Visit Note  CC: Marton Redwood, MD  Marton Redwood, MD    ICD-10-CM   1. Endometrial cancer determined by uterine biopsy (HCC)  C54.1     Diagnosis:   68 y.o. 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 06/11/17 - 07/15/17 (external beam): Pelvis / 45 Gy in 25 fractions 2. 07/21/17 - 08/03/17 (brachytherapy): Vaginal cuff / 18 Gy in 3 fractions  Narrative:  The patient returns today to review her recent scans. She is accompanied by her daughter via FaceTime. She last saw Dr. Denman George on 10/15/2018.  She underwent abdomen MRI with MRCP on 01/20/2019. This showed: pancreatic duct caliber change in the mid pancreatic body with a subtle lesion identified, concerning for a small pancreatic neoplasm obstructing the duct.  On review of systems, she reports intermittent scant tan-colored vaginal discharge.              ALLERGIES:  is allergic to sulfur.  Meds: Current Outpatient Medications  Medication Sig Dispense Refill   latanoprost (XALATAN) 0.005 % ophthalmic solution INSTILL 1 DROP INTO EACH EYE NIGHTLY     metFORMIN (GLUCOPHAGE) 1000 MG tablet Take 1,000 mg by mouth 2 (two) times daily with a meal.     rosuvastatin (CRESTOR) 10 MG tablet Take 10 mg by mouth daily.     tretinoin (RETIN-A) 0.1 % cream APPLY A THIN LAYER TO FACE ONCE DAILY AT NIGHT     triamcinolone cream (KENALOG) 0.1 %      zolpidem (AMBIEN CR) 12.5 MG CR tablet Take 12.5 mg by mouth at bedtime as needed for sleep.     Current Facility-Administered Medications  Medication Dose Route Frequency Provider Last Rate Last Dose   0.9 %  sodium chloride infusion   500 mL Intravenous Continuous Milus Banister, MD        Physical Findings: The patient is in no acute distress. Patient is alert and oriented.  weight is 96 lb 9.6 oz (43.8 kg). Her temporal temperature is 98.5 F (36.9 C). Her blood pressure is 131/91 (abnormal) and her pulse is 99. Her respiration is 16 and oxygen saturation is 97%.    Lab Findings: Lab Results  Component Value Date   WBC 4.7 12/29/2018   HGB 12.4 12/29/2018   HCT 36.6 12/29/2018   MCV 94.6 12/29/2018   PLT 269 12/29/2018    Radiographic Findings: Ct Abdomen Pelvis W Contrast  Result Date: 12/29/2018 CLINICAL DATA:  Follow-up endometrial carcinoma. Status post hysterectomy and radiation therapy. EXAM: CT ABDOMEN AND PELVIS WITH CONTRAST TECHNIQUE: Multidetector CT imaging of the abdomen and pelvis was performed using the standard protocol following bolus administration of intravenous contrast. CONTRAST:  87mL OMNIPAQUE IOHEXOL 300 MG/ML  SOLN COMPARISON:  None. FINDINGS: Lower Chest: No acute findings. Hepatobiliary: No hepatic masses identified. Gallbladder is unremarkable. No evidence of biliary ductal dilatation. Pancreas: Mild pancreatic ductal dilatation is seen in the body and tail, with transition point in the pancreatic body. No definite mass visualized by CT. No evidence of peripancreatic inflammatory changes or fluid collections. Spleen: Within normal limits in size and appearance. Adrenals/Urinary Tract: No masses identified. No evidence of hydronephrosis. Unremarkable unopacified urinary  bladder. Stomach/Bowel: No evidence of obstruction, inflammatory process or abnormal fluid collections. Vascular/Lymphatic: No pathologically enlarged lymph nodes. No abdominal aortic aneurysm. Aortic atherosclerosis. Reproductive: Prior hysterectomy noted. No mass identified in the surgical bed or elsewhere within the pelvis. Adnexal regions are unremarkable in appearance. Other:  None. Musculoskeletal: No suspicious bone  lesions identified. Bilateral L5 pars defects are seen with severe degenerative disc disease and grade 1 anterolisthesis at L5-S1. IMPRESSION: 1. Prior hysterectomy. No evidence of recurrent or metastatic endometrium carcinoma. 2. Mild pancreatic ductal dilatation in body and tail, with transition point in pancreatic body. No etiology visualized by CT. Recommend abdomen MRI and MRCP without and with contrast to exclude an occult pancreatic mass. Electronically Signed   By: Marlaine Hind M.D.   On: 12/29/2018 13:26   Mr 3d Recon At Scanner  Result Date: 01/20/2019 CLINICAL DATA:  Endometrial carcinoma. Ductal dilatation in the pancreas identified on CT exam. EXAM: MRI ABDOMEN WITHOUT AND WITH CONTRAST (INCLUDING MRCP) TECHNIQUE: Multiplanar multisequence MR imaging of the abdomen was performed both before and after the administration of intravenous contrast. Heavily T2-weighted images of the biliary and pancreatic ducts were obtained, and three-dimensional MRCP images were rendered by post processing. CONTRAST:  72mL GADAVIST GADOBUTROL 1 MMOL/ML IV SOLN COMPARISON:  CT 12/29/2018 FINDINGS: Lower chest:  Lung bases are clear. Hepatobiliary: No focal hepatic lesion. No biliary duct dilatation. Common hepatic duct is dilated to 9 mm. There is a transition to normal caliber common bile duct at 5 mm (image 55/400) as the duct enters the pancreatic head. No lesion identified. Gallbladder normal. Pancreas: Within the mid body of the pancreas there is a caliber change of the pancreatic duct on T2 weighted imaging image 18/3. Proximal to this level the pancreatic duct measures 3 mm (image 14/3) and distal to this level the pancreatic duct measures 1 mm. There is a short gap ductal in definition at this level of the caliber change. On the precontrast T1 weighted imaging, there is a subtle hypointense lesion at this level measuring 6 mm x 4 mm (image 33/88). This lesion may also subtly be appreciated on the opposed phase  imaging (image 22/9). Postcontrast enhanced imaging demonstrates a gap in the duct at this level with no clear enhancing lesion (image 33/1101). Spleen: Normal spleen. Adrenals/urinary tract: Adrenal glands and kidneys are normal. Stomach/Bowel: Stomach and limited of the small bowel is unremarkable Vascular/Lymphatic: Abdominal aortic normal caliber. No retroperitoneal periportal lymphadenopathy. Musculoskeletal: No aggressive osseous lesion IMPRESSION: 1. Pancreatic duct caliber change in the mid pancreatic body with a subtle lesion identified. Findings concerning for a small PANCREATIC NEOPLASM obstructing the duct. Recommend EUS evaluation and potential sampling of this small lesion. 2. Normal liver. 3. Normal biliary tree. These results will be called to the ordering clinician or representative by the Radiologist Assistant, and communication documented in the PACS or zVision Dashboard. Electronically Signed   By: Suzy Bouchard M.D.   On: 01/20/2019 08:53   Mr Abdomen Mrcp Moise Boring Contast  Result Date: 01/20/2019 CLINICAL DATA:  Endometrial carcinoma. Ductal dilatation in the pancreas identified on CT exam. EXAM: MRI ABDOMEN WITHOUT AND WITH CONTRAST (INCLUDING MRCP) TECHNIQUE: Multiplanar multisequence MR imaging of the abdomen was performed both before and after the administration of intravenous contrast. Heavily T2-weighted images of the biliary and pancreatic ducts were obtained, and three-dimensional MRCP images were rendered by post processing. CONTRAST:  47mL GADAVIST GADOBUTROL 1 MMOL/ML IV SOLN COMPARISON:  CT 12/29/2018 FINDINGS: Lower chest:  Lung bases are  clear. Hepatobiliary: No focal hepatic lesion. No biliary duct dilatation. Common hepatic duct is dilated to 9 mm. There is a transition to normal caliber common bile duct at 5 mm (image 55/400) as the duct enters the pancreatic head. No lesion identified. Gallbladder normal. Pancreas: Within the mid body of the pancreas there is a caliber  change of the pancreatic duct on T2 weighted imaging image 18/3. Proximal to this level the pancreatic duct measures 3 mm (image 14/3) and distal to this level the pancreatic duct measures 1 mm. There is a short gap ductal in definition at this level of the caliber change. On the precontrast T1 weighted imaging, there is a subtle hypointense lesion at this level measuring 6 mm x 4 mm (image 33/88). This lesion may also subtly be appreciated on the opposed phase imaging (image 22/9). Postcontrast enhanced imaging demonstrates a gap in the duct at this level with no clear enhancing lesion (image 33/1101). Spleen: Normal spleen. Adrenals/urinary tract: Adrenal glands and kidneys are normal. Stomach/Bowel: Stomach and limited of the small bowel is unremarkable Vascular/Lymphatic: Abdominal aortic normal caliber. No retroperitoneal periportal lymphadenopathy. Musculoskeletal: No aggressive osseous lesion IMPRESSION: 1. Pancreatic duct caliber change in the mid pancreatic body with a subtle lesion identified. Findings concerning for a small PANCREATIC NEOPLASM obstructing the duct. Recommend EUS evaluation and potential sampling of this small lesion. 2. Normal liver. 3. Normal biliary tree. These results will be called to the ordering clinician or representative by the Radiologist Assistant, and communication documented in the PACS or zVision Dashboard. Electronically Signed   By: Suzy Bouchard M.D.   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  Recent MRI of the abdomen  confirms questionable pancreatic mass which has resulted in ductal dilatation. Further workup is indicated to rule out pancreatic neoplasm. The patient does have a diagnosis of diabetes mellitus. She denies a prior history of pancreatitis. Radiology recommends ERCP.   Plan:   Patient will be set up for consultation with Owens Loffler in gastroenterology for further evaluation. Patient is to keep her already-scheduled follow ups with myself and Dr. Denman George for her endometrial cancer.  ____________________________________  Blair Promise, PhD, MD  This document serves as a record of services personally performed by Gery Pray, MD. It was created on his behalf by Wilburn Mylar, a trained medical scribe. The creation of this record is based on the scribe's personal observations and the provider's statements to them. This document has been checked and approved by the attending provider.

## 2019-01-25 ENCOUNTER — Other Ambulatory Visit: Payer: Self-pay

## 2019-01-25 ENCOUNTER — Telehealth: Payer: Self-pay

## 2019-01-25 DIAGNOSIS — K869 Disease of pancreas, unspecified: Secondary | ICD-10-CM

## 2019-01-25 DIAGNOSIS — K8689 Other specified diseases of pancreas: Secondary | ICD-10-CM

## 2019-01-25 NOTE — Telephone Encounter (Signed)
EUS scheduled, pt instructed and medications reviewed.  Patient instructions mailed to home.  Patient to call with any questions or concerns. Pt also given COVID testing information.  The pt has been advised of the information and verbalized understanding.

## 2019-01-25 NOTE — Telephone Encounter (Signed)
EUS scheduled for 02/17/19 at 830 am at [facility] with Dr Ardis Hughs.  COVID scheduled for 8 am on 02/14/19.  Left message on machine to call back

## 2019-01-25 NOTE — Telephone Encounter (Signed)
Mansouraty, Telford Nab., MD  Milus Banister, MD; Gery Pray, MD; Everitt Amber, MD; Timothy Lasso, RN        Thanks.  GM   Previous Messages  ----- Message -----  From: Milus Banister, MD  Sent: 01/25/2019  6:08 AM EDT  To: Gery Pray, MD, Everitt Amber, MD, *  Subject: RE: Inpatient Notes                Thanks for the message. We will reach out to her.   Kyleigha Markert,  She has an incidental lesion in her pancreas noted on recent CT/MRI (small, but clearly present and causing dilation of main PD). This needs further evaluation with EUS. Please arrange for first available EUS time with myself or Dr. Rush Landmark.   Wynetta Fines    Thanks   DJ  ----- Message -----  From: Gery Pray, MD  Sent: 01/24/2019  5:59 PM EDT  To: Milus Banister, MD, Everitt Amber, MD  Subject: Inpatient Notes

## 2019-02-01 DIAGNOSIS — M8580 Other specified disorders of bone density and structure, unspecified site: Secondary | ICD-10-CM | POA: Diagnosis not present

## 2019-02-01 DIAGNOSIS — E119 Type 2 diabetes mellitus without complications: Secondary | ICD-10-CM | POA: Diagnosis not present

## 2019-02-01 DIAGNOSIS — E7849 Other hyperlipidemia: Secondary | ICD-10-CM | POA: Diagnosis not present

## 2019-02-08 DIAGNOSIS — K869 Disease of pancreas, unspecified: Secondary | ICD-10-CM | POA: Diagnosis not present

## 2019-02-08 DIAGNOSIS — R82998 Other abnormal findings in urine: Secondary | ICD-10-CM | POA: Diagnosis not present

## 2019-02-08 DIAGNOSIS — Z803 Family history of malignant neoplasm of breast: Secondary | ICD-10-CM | POA: Diagnosis not present

## 2019-02-08 DIAGNOSIS — Z0000 Encounter for general adult medical examination without abnormal findings: Secondary | ICD-10-CM | POA: Diagnosis not present

## 2019-02-08 DIAGNOSIS — I498 Other specified cardiac arrhythmias: Secondary | ICD-10-CM | POA: Diagnosis not present

## 2019-02-08 DIAGNOSIS — C541 Malignant neoplasm of endometrium: Secondary | ICD-10-CM | POA: Diagnosis not present

## 2019-02-08 DIAGNOSIS — E119 Type 2 diabetes mellitus without complications: Secondary | ICD-10-CM | POA: Diagnosis not present

## 2019-02-08 DIAGNOSIS — M8580 Other specified disorders of bone density and structure, unspecified site: Secondary | ICD-10-CM | POA: Diagnosis not present

## 2019-02-08 DIAGNOSIS — Z23 Encounter for immunization: Secondary | ICD-10-CM | POA: Diagnosis not present

## 2019-02-08 DIAGNOSIS — E785 Hyperlipidemia, unspecified: Secondary | ICD-10-CM | POA: Diagnosis not present

## 2019-02-14 ENCOUNTER — Other Ambulatory Visit (HOSPITAL_COMMUNITY)
Admission: RE | Admit: 2019-02-14 | Discharge: 2019-02-14 | Disposition: A | Discharge status: Home / Self Care | Payer: Medicare Other | Source: Ambulatory Visit | Attending: Gastroenterology | Admitting: Gastroenterology

## 2019-02-14 ENCOUNTER — Other Ambulatory Visit: Payer: Self-pay

## 2019-02-14 ENCOUNTER — Encounter (HOSPITAL_COMMUNITY): Payer: Self-pay | Admitting: *Deleted

## 2019-02-14 DIAGNOSIS — Z20828 Contact with and (suspected) exposure to other viral communicable diseases: Secondary | ICD-10-CM | POA: Insufficient documentation

## 2019-02-14 DIAGNOSIS — K869 Disease of pancreas, unspecified: Secondary | ICD-10-CM | POA: Diagnosis not present

## 2019-02-14 DIAGNOSIS — Z01812 Encounter for preprocedural laboratory examination: Secondary | ICD-10-CM | POA: Insufficient documentation

## 2019-02-15 LAB — NOVEL CORONAVIRUS, NAA (HOSP ORDER, SEND-OUT TO REF LAB; TAT 18-24 HRS): SARS-CoV-2, NAA: NOT DETECTED

## 2019-02-16 NOTE — Anesthesia Preprocedure Evaluation (Addendum)
Anesthesia Evaluation  Patient identified by MRN, date of birth, ID band Patient awake  General Assessment Comment:Complication of anesthesia AGE LATE 20'S SLOW TO AWAKEN AFTER DERMOID CYST REMOVED FROM [place]   Reviewed: Allergy & Precautions, H&P , NPO status , Patient's Chart, lab work & pertinent test results, reviewed documented beta blocker date and time   History of Anesthesia Complications (+) history of anesthetic complications  Airway Mallampati: I  TM Distance: >3 FB Neck ROM: full    Dental no notable dental hx. (+) Teeth Intact, Dental Advisory Given   Pulmonary neg pulmonary ROS,    Pulmonary exam normal breath sounds clear to auscultation       Cardiovascular Exercise Tolerance: Good negative cardio ROS   Rhythm:regular Rate:Normal     Neuro/Psych negative neurological ROS  negative psych ROS   GI/Hepatic negative GI ROS, Neg liver ROS,   Endo/Other  negative endocrine ROSdiabetes, Type 2  Renal/GU negative Renal ROS  negative genitourinary   Musculoskeletal   Abdominal   Peds  Hematology negative hematology ROS (+)   Anesthesia Other Findings   Reproductive/Obstetrics negative OB ROS                            Anesthesia Physical Anesthesia Plan  ASA: III  Anesthesia Plan: General   Post-op Pain Management:    Induction: Intravenous  PONV Risk Score and Plan:   Airway Management Planned: Mask and Natural Airway  Additional Equipment:   Intra-op Plan:   Post-operative Plan:   Informed Consent: I have reviewed the patients History and Physical, chart, labs and discussed the procedure including the risks, benefits and alternatives for the proposed anesthesia with the patient or authorized representative who has indicated his/her understanding and acceptance.     Dental Advisory Given  Plan Discussed with: CRNA  Anesthesia Plan  Comments:         Anesthesia Quick Evaluation

## 2019-02-16 NOTE — Progress Notes (Signed)
Attempted pre-call. Left message 

## 2019-02-17 ENCOUNTER — Encounter (HOSPITAL_COMMUNITY): Payer: Self-pay | Admitting: Certified Registered Nurse Anesthetist

## 2019-02-17 ENCOUNTER — Encounter (HOSPITAL_COMMUNITY)
Admission: RE | Disposition: A | Discharge status: Home / Self Care | Payer: Self-pay | Source: Home / Self Care | Attending: Gastroenterology

## 2019-02-17 ENCOUNTER — Ambulatory Visit (HOSPITAL_COMMUNITY): Payer: Medicare Other | Admitting: Anesthesiology

## 2019-02-17 ENCOUNTER — Ambulatory Visit (HOSPITAL_COMMUNITY)
Admission: RE | Admit: 2019-02-17 | Discharge: 2019-02-17 | Disposition: A | Discharge status: Home / Self Care | Payer: Medicare Other | Attending: Gastroenterology | Admitting: Gastroenterology

## 2019-02-17 ENCOUNTER — Other Ambulatory Visit: Payer: Self-pay

## 2019-02-17 DIAGNOSIS — K869 Disease of pancreas, unspecified: Secondary | ICD-10-CM

## 2019-02-17 DIAGNOSIS — Z923 Personal history of irradiation: Secondary | ICD-10-CM | POA: Diagnosis not present

## 2019-02-17 DIAGNOSIS — Z7984 Long term (current) use of oral hypoglycemic drugs: Secondary | ICD-10-CM | POA: Insufficient documentation

## 2019-02-17 DIAGNOSIS — Z90710 Acquired absence of both cervix and uterus: Secondary | ICD-10-CM | POA: Diagnosis not present

## 2019-02-17 DIAGNOSIS — Z8542 Personal history of malignant neoplasm of other parts of uterus: Secondary | ICD-10-CM | POA: Insufficient documentation

## 2019-02-17 DIAGNOSIS — K8689 Other specified diseases of pancreas: Secondary | ICD-10-CM

## 2019-02-17 DIAGNOSIS — E119 Type 2 diabetes mellitus without complications: Secondary | ICD-10-CM | POA: Insufficient documentation

## 2019-02-17 DIAGNOSIS — R591 Generalized enlarged lymph nodes: Secondary | ICD-10-CM | POA: Diagnosis not present

## 2019-02-17 HISTORY — DX: Other complications of anesthesia, initial encounter: T88.59XA

## 2019-02-17 HISTORY — DX: Disease of pancreas, unspecified: K86.9

## 2019-02-17 HISTORY — PX: EUS: SHX5427

## 2019-02-17 HISTORY — PX: ESOPHAGOGASTRODUODENOSCOPY (EGD) WITH PROPOFOL: SHX5813

## 2019-02-17 LAB — GLUCOSE, CAPILLARY: Glucose-Capillary: 87 mg/dL (ref 70–99)

## 2019-02-17 SURGERY — UPPER ENDOSCOPIC ULTRASOUND (EUS) RADIAL
Anesthesia: General

## 2019-02-17 MED ORDER — SODIUM CHLORIDE 0.9 % IV SOLN: INTRAVENOUS | Status: DC

## 2019-02-17 MED ORDER — PROPOFOL 10 MG/ML IV BOLUS
INTRAVENOUS | Status: DC | PRN
  Administered 2019-02-17: 30 mg via INTRAVENOUS

## 2019-02-17 MED ORDER — LACTATED RINGERS IV SOLN
INTRAVENOUS | Status: DC
  Administered 2019-02-17: 08:00:00 via INTRAVENOUS

## 2019-02-17 MED ORDER — LIDOCAINE 2% (20 MG/ML) 5 ML SYRINGE
INTRAMUSCULAR | Status: DC | PRN
  Administered 2019-02-17: 80 mg via INTRAVENOUS

## 2019-02-17 MED ORDER — PROPOFOL 500 MG/50ML IV EMUL
INTRAVENOUS | Status: DC | PRN
  Administered 2019-02-17: 125 ug/kg/min via INTRAVENOUS

## 2019-02-17 NOTE — Op Note (Signed)
[facility] Patient Name: Kimberly [name] Procedure Date: 02/17/2019 MRN: [id number] Attending MD: Milus Banister , MD Date of Birth: 07-31-1950 CSN: [id number] Age: 68 Admit Type: Outpatient Procedure:                Upper EUS Indications:              Personal history of endometrial cancer;                            surveillance imaging with CT then MRI: "Within the                            mid body of the pancreas there is a caliber change                            of the pancreatic duct on T2 weighted imaging image                            18/3. Proximal to this level the pancreatic duct                            measures 3 mm (image 14/3) and distal to this level                            the pancreatic duct measures 1 mm. There is a short                            gap ductal in definition at this level of the                            caliber change". No personal history of previous                            pancreatic disease, no FH of pancreatic cancer, no                            weight loss or serious abdominal pains. Providers:                Milus Banister, MD, Jeanella Cara, RN,                            William Dalton, Technician Referring MD:             Gery Pray, MD [facility]) Medicines:                Monitored Anesthesia Care Complications:            No immediate complications. Estimated blood loss:                            None. Estimated Blood Loss:     Estimated blood loss: none. Procedure:                Pre-Anesthesia Assessment:                           -  Prior to the procedure, a History and Physical                            was performed, and patient medications and                            allergies were reviewed. The patient's tolerance of                            previous anesthesia was also reviewed. The risks                            and benefits of the procedure and the sedation                             options and risks were discussed with the patient.                            All questions were answered, and informed consent                            was obtained. Prior Anticoagulants: The patient has                            taken no previous anticoagulant or antiplatelet                            agents. ASA Grade Assessment: II - A patient with                            mild systemic disease. After reviewing the risks                            and benefits, the patient was deemed in                            satisfactory condition to undergo the procedure.                           After obtaining informed consent, the endoscope was                            passed under direct vision. Throughout the                            procedure, the patient's blood pressure, pulse, and                            oxygen saturations were monitored continuously. The                            [id number]) Olympus Radial EUS was  introduced through the mouth, and advanced to the                            second part of duodenum. The upper EUS was                            accomplished without difficulty. The patient                            tolerated the procedure well. Scope In: Scope Out: Findings:      ENDOSCOPIC FINDING (limited views with radial and linear       echoendoscopes): :      The examined esophagus was endoscopically normal.      The entire examined stomach was endoscopically normal.      The examined duodenum was endoscopically normal.      ENDOSONOGRAPHIC FINDING: :      1. Focal main pancreatic duct ectasia without associated solid or cystic       mass. In the proximal pancreatic body the main pancreatic duct was       focally narrowed and ectatic for about 64mm. The main duct more       proximally (towards the head) was normal, non-dilated. The main       pancreatic duct more distall (towards the  tail) was also non-dilated.       Using both radial and linear echoendoscopes I was unable to identify an       associated solid or cystic mass at this site of ductal ectasia.      2. Pancreatic parenchyma was normal throughout; no masses or signs of       chronic pancreatitis.      3. No peripancreatic adenopathy.      4. CBD was normal, non-dilated      5. Limited views of the liver, spleen, portal and splenic vessels were       all normal. Impression:               - Focal main pancreatic duct ectasia in the                            proximal body of the pancreas, not associated with                            solid or cystic mass lesions. This is of unclear                            clinical significance but I doubt underlying                            neoplasm at this point.                           - I will plan to repeat imaging with MRI/MRCP of                            the pancreas in 3 months to check for interval  change. My office will arrange. Moderate Sedation:      Not Applicable - Patient had care per Anesthesia. Recommendation:           - Discharge patient to home. Procedure Code(s):        --- Professional ---                           [phone], Esophagogastroduodenoscopy, flexible,                            transoral; with endoscopic ultrasound examination                            limited to the esophagus, stomach or duodenum, and                            adjacent structures Diagnosis Code(s):        --- Professional ---                           R59.1, Generalized enlarged lymph nodes CPT copyright 2019 American [employer] Association. All rights reserved. The codes documented in this report are preliminary and upon coder review may  be revised to meet current compliance requirements. Milus Banister, MD 02/17/2019 9:24:13 AM This report has been signed electronically. Number of Addenda: 0

## 2019-02-17 NOTE — Transfer of Care (Signed)
Immediate Anesthesia Transfer of Care Note  Patient: Kimberly [name]  Procedure(s) Performed: UPPER ENDOSCOPIC ULTRASOUND (EUS) RADIAL (N/A )  Patient Location: Endoscopy Unit  Anesthesia Type:MAC  Level of Consciousness: awake, alert , oriented and patient cooperative  Airway & Oxygen Therapy: Patient Spontanous Breathing and Patient connected to face mask oxygen  Post-op Assessment: Report given to RN, Post -op Vital signs reviewed and stable and Patient moving all extremities X 4  Post vital signs: Reviewed and stable  Last Vitals:  Vitals Value Taken Time  BP    Temp    Pulse 71 02/17/19 0915  Resp 14 02/17/19 0915  SpO2 100 % 02/17/19 0915  Vitals shown include unvalidated device data.  Last Pain:  Vitals:   02/17/19 0801  TempSrc: Oral  PainSc: 0-No pain         Complications: No apparent anesthesia complications

## 2019-02-17 NOTE — Discharge Instructions (Signed)
YOU HAD AN ENDOSCOPIC PROCEDURE TODAY: Refer to the procedure report and other information in the discharge instructions given to you for any specific questions about what was found during the examination. If this information does not answer your questions, please call [place] office at [phone] to clarify.  ° °YOU SHOULD EXPECT: Some feelings of bloating in the abdomen. Passage of more gas than usual. Walking can help get rid of the air that was put into your GI tract during the procedure and reduce the bloating. If you had a lower endoscopy (such as a colonoscopy or flexible sigmoidoscopy) you may notice spotting of blood in your stool or on the toilet paper. Some abdominal soreness may be present for a day or two, also. ° °DIET: Your first meal following the procedure should be a light meal and then it is ok to progress to your normal diet. A half-sandwich or bowl of soup is an example of a good first meal. Heavy or fried foods are harder to digest and may make you feel nauseous or bloated. Drink plenty of fluids but you should avoid alcoholic beverages for 24 hours. If you had a esophageal dilation, please see attached instructions for diet.   ° °ACTIVITY: Your care partner should take you home directly after the procedure. You should plan to take it easy, moving slowly for the rest of the day. You can resume normal activity the day after the procedure however YOU SHOULD NOT DRIVE, use power tools, machinery or perform tasks that involve climbing or major physical exertion for 24 hours (because of the sedation medicines used during the test).  ° °SYMPTOMS TO REPORT IMMEDIATELY: °A gastroenterologist can be reached at any hour. Please call [phone]  for any of the following symptoms:  °Following lower endoscopy (colonoscopy, flexible sigmoidoscopy) °Excessive amounts of blood in the stool  °Significant tenderness, worsening of abdominal pains  °Swelling of the abdomen that is new, acute  °Fever of 100° or  higher  °Following upper endoscopy (EGD, EUS, ERCP, esophageal dilation) °Vomiting of blood or coffee ground material  °New, significant abdominal pain  °New, significant chest pain or pain under the shoulder blades  °Painful or persistently difficult swallowing  °New shortness of breath  °Black, tarry-looking or red, bloody stools ° °FOLLOW UP:  °If any biopsies were taken you will be contacted by phone or by letter within the next 1-3 weeks. Call [phone]  if you have not heard about the biopsies in 3 weeks.  °Please also call with any specific questions about appointments or follow up tests. ° °

## 2019-02-17 NOTE — Interval H&P Note (Signed)
History and Physical Interval Note:  02/17/2019 7:42 AM  Kimberly [name]  has presented today for surgery, with the diagnosis of PD dilation, pancreatic lesion.  The various methods of treatment have been discussed with the patient and family. After consideration of risks, benefits and other options for treatment, the patient has consented to  Procedure(s): UPPER ENDOSCOPIC ULTRASOUND (EUS) RADIAL (N/A) as a surgical intervention.  The patient's history has been reviewed, patient examined, no change in status, stable for surgery.  I have reviewed the patient's chart and labs.  Questions were answered to the patient's satisfaction.     Milus Banister

## 2019-02-17 NOTE — Anesthesia Postprocedure Evaluation (Signed)
Anesthesia Post Note  Patient: Kimberly [name]  Procedure(s) Performed: UPPER ENDOSCOPIC ULTRASOUND (EUS) RADIAL (N/A )     Anesthesia Type: General    Last Vitals:  Vitals:   02/17/19 0930 02/17/19 0942  BP: (!) 148/71 (!) 150/78  Pulse: 67 71  Resp: 13 (!) 26  Temp:    SpO2: 100% 100%    Last Pain:  Vitals:   02/17/19 0942  TempSrc:   PainSc: 0-No pain                 Hartlee Amedee

## 2019-02-18 ENCOUNTER — Encounter (HOSPITAL_COMMUNITY): Payer: Self-pay | Admitting: Gastroenterology

## 2019-02-21 ENCOUNTER — Telehealth: Payer: Self-pay

## 2019-02-21 NOTE — Telephone Encounter (Signed)
Staff message to call pt in 3 months to set up MRI MRCP

## 2019-02-21 NOTE — Telephone Encounter (Signed)
-----   Message from Milus Banister, MD sent at 02/17/2019  9:25 AM EDT ----- Dr. Sondra Come,  I just completed EUS examination of her pancreas. See full report in Epic>  - Focal main pancreatic duct ectasia in the proximal body of the pancreas, not associated with solid or cystic mass lesions. This is of unclear clinical significance but I doubt underlying neoplasm at this point. - I will plan to repeat imaging with MRI/MRCP of the pancreas in 3 months to check for interval change.  My office will arrange.   Thanks,  Adrienne Mocha, She needs MRI with MRCP in 3 months to follow 'abnormal main pancreatic duct'  thanks.

## 2019-02-25 ENCOUNTER — Telehealth: Payer: Self-pay | Admitting: Oncology

## 2019-02-25 ENCOUNTER — Telehealth: Payer: Self-pay | Admitting: Gastroenterology

## 2019-02-25 NOTE — Telephone Encounter (Signed)
Kimberly [name] called and asked about her MRI results from 02/17/2019.  Advised her that the last MRI results I could see were from 01/20/2019.  She said it was preformed at [facility] by Dr. Ardis Hughs.  Advised her to call Dr. Ardis Hughs office to see if they could find the results.  After further review, Kimberly [name] had an upper EUS with Dr. Ardis Hughs on 02/17/2019.  Left patient a message to ask Dr. Ardis Hughs office about EUS results.

## 2019-02-25 NOTE — Telephone Encounter (Signed)
Pt inquired about results of EUS.

## 2019-02-25 NOTE — Telephone Encounter (Signed)
Not sure what she is referring to. No biopsies.  Just planned for imaging in 3 months.  Thanks.  Offer to send her another copy of the procedure report if she would like that.

## 2019-02-25 NOTE — Telephone Encounter (Signed)
Left message on machine to call back  

## 2019-02-25 NOTE — Telephone Encounter (Signed)
I spoke with the and she was given the information per Dr Ardis Hughs.  I have also sent her a copy of the report to her My Chart.  She will call with any further questions or concerns

## 2019-02-25 NOTE — Telephone Encounter (Signed)
Dr Ardis Hughs did you do any biopsy when the pt had her EUS on 10/8.  She is calling looking for results.  She tells me that you told her she would be hearing back in about 3 days.  Please advise

## 2019-03-07 ENCOUNTER — Ambulatory Visit: Payer: Medicare Other | Admitting: Podiatry

## 2019-03-29 ENCOUNTER — Encounter: Payer: Self-pay | Admitting: Gynecologic Oncology

## 2019-03-29 ENCOUNTER — Other Ambulatory Visit: Payer: Self-pay

## 2019-03-29 ENCOUNTER — Inpatient Hospital Stay: Payer: Medicare Other | Attending: Gynecologic Oncology | Admitting: Gynecologic Oncology

## 2019-03-29 VITALS — BP 117/77 | HR 88 | Temp 97.6°F | Resp 17 | Ht 62.0 in | Wt 98.7 lb

## 2019-03-29 DIAGNOSIS — Z90722 Acquired absence of ovaries, bilateral: Secondary | ICD-10-CM | POA: Insufficient documentation

## 2019-03-29 DIAGNOSIS — Z90710 Acquired absence of both cervix and uterus: Secondary | ICD-10-CM | POA: Insufficient documentation

## 2019-03-29 DIAGNOSIS — Z79899 Other long term (current) drug therapy: Secondary | ICD-10-CM | POA: Insufficient documentation

## 2019-03-29 DIAGNOSIS — Z7982 Long term (current) use of aspirin: Secondary | ICD-10-CM | POA: Diagnosis not present

## 2019-03-29 DIAGNOSIS — Z923 Personal history of irradiation: Secondary | ICD-10-CM | POA: Diagnosis not present

## 2019-03-29 DIAGNOSIS — C541 Malignant neoplasm of endometrium: Secondary | ICD-10-CM

## 2019-03-29 DIAGNOSIS — Z7984 Long term (current) use of oral hypoglycemic drugs: Secondary | ICD-10-CM | POA: Insufficient documentation

## 2019-03-29 NOTE — Progress Notes (Signed)
Follow-up Note: Gyn-Onc  Consult was requested by Dr. Benjie Karvonen for the evaluation of Kimberly [name] 68 y.o. female  CC:  Chief Complaint  Patient presents with  . Endometrial cancer     Assessment/Plan:  Ms. Kimberly [name]  is a 68 y.o.  year old with stage IB grade 3 endometrioid endometrial cancer (MSI low/stable). Surgery December, 2018. S/p adjuvant external beam and bra chytherapy completed March, 2019. No evidence of disease recurrence. I recommend continuing 3 monthly evaluations until March, 2021 I will see her in 6 months, Dr Sondra Come will see her in 3 months. Counseled regarding symtpoms of recurrence.  HPI: Kimberly [name] is a 68 year old P2 who is seen in consultation at the request of Dr Benjie Karvonen for grade 2 endometrial cancer.  The patient has a history of postmenopausal spotting since April, 2018. She was seen by a PA in her PCP's office in July, 2018 and a pap test was performed which per patient was negative and therefore no intervention followed. The patient continued to have spotting and informed her PCP, Dr Brigitte Pulse, of this in October during her annual physical. He then promptly referred her to Dr [facility] office 02/27/17 and a TVUS was performed which showed a uterus measuring normal dimensions and a 53m endometrial stripe.  On 02/28/17 an office pipelle biopsy was performed and revealed a FIGO grade 2 endometrioid endometrial cancer.  The patient has a history of a prior USO (? Right) many years ago for a benign cyst on the ovary. She has also had an open appendectomy remotely. She is thin (103lbs) and otherwise very healthy.  She has had L5 surgery through posterior approach.  Her sister has a history of breast and ovarian cancer, and while she was not tested, Ms Kimberly [name] underwent genetic testing for BRCA and MLH/MSH abnormalities which were unremarkable.    On 04/13/17 she underwent robotic assisted total hysterectomy BSO, [facility]  revealed a deeply invasive grade 3 endometrioid tumor with multifocal LVSI present and ITCs present in a left external iliac SLN. Assiged stage IB grade 3. MSI low (stable). Due to these high risk features, she was determined to be at high risk for recurrence and adjuvant external beam radiation with vaginal brachytherapy were recommended.   She went on to receive external beam and vaginal brachytherapy completed in February, 2019. She tolerated therapy well.  Interval Hx:  The patient received a CT scan of the abdomen and pelvis on December 29, 2018.  This showed no evidence of recurrent or metastatic endometrial cancer, however it did reveal mild pancreatic ductal dilation in the body and tail with a transition point in the pancreatic body.  She then underwent a follow-up MR of the pancreas which confirmed the ectatic pancreatic duct and endoscopic ultrasound was recommended.  In early October 2020 she underwent an endoscopic ultrasound scan which revealed no mass or cyst in the pancreatic tail just the ectatic duct.  No biopsies were performed.  Recommendation was made for follow-up MRI in 3 months.   She is doing well without any complaints.   Current Meds:  Outpatient Encounter Medications as of 03/29/2019  Medication Sig  . Ascorbic Acid (VITAMIN C) 1000 MG tablet Take 1,000 mg by mouth daily.  .Marland Kitchenaspirin EC 81 MG tablet Take 81 mg by mouth daily.  .Marland Kitchenb complex vitamins tablet Take 1 tablet by mouth daily.  . Biotin 1000 MCG tablet Take 1,000 mcg by mouth daily.  .Marland Kitchen  Ca Phosphate-Cholecalciferol (CALTRATE GUMMY BITES) 250-400 MG-UNIT CHEW Chew 3 tablets by mouth at bedtime.  . Calcium Carbonate-Vitamin D (CALTRATE 600+D PO) Take 1 tablet by mouth daily.  . Cholecalciferol (VITAMIN D) 50 MCG (2000 UT) tablet Take 2,000 Units by mouth daily.  . diphenhydrAMINE (BENADRYL) 25 MG tablet Take 25 mg by mouth daily as needed for allergies.  . ferrous sulfate 325 (65 FE) MG tablet Take 325 mg by mouth  daily with breakfast.  . latanoprost (XALATAN) 0.005 % ophthalmic solution Place 1 drop into both eyes at bedtime.   . metFORMIN (GLUCOPHAGE) 1000 MG tablet Take 1,000 mg by mouth 2 (two) times daily with a meal.  . Multiple Vitamin (MULTIVITAMIN WITH MINERALS) TABS tablet Take 1 tablet by mouth daily.  . rosuvastatin (CRESTOR) 10 MG tablet Take 10 mg by mouth at bedtime.   . tretinoin (RETIN-A) 0.1 % cream Apply 1 application topically 3 (three) times a week. At night  . zolpidem (AMBIEN CR) 12.5 MG CR tablet Take 12.5 mg by mouth at bedtime.    Facility-Administered Encounter Medications as of 03/29/2019  Medication  . 0.9 %  sodium chloride infusion    Allergy:  Allergies  Allergen Reactions  . Sulfur Hives    Social Hx:   Social History   Socioeconomic History  . Marital status: Divorced    Spouse name: Not on file  . Number of children: Not on file  . Years of education: Not on file  . Highest education level: Not on file  Occupational History  . Not on file  Social Needs  . Financial resource strain: Not on file  . Food insecurity    Worry: Not on file    Inability: Not on file  . Transportation needs    Medical: Not on file    Non-medical: Not on file  Tobacco Use  . Smoking status: Never Smoker  . Smokeless tobacco: Never Used  Substance and Sexual Activity  . Alcohol use: Yes    Alcohol/week: 7.0 standard drinks    Types: 7 Glasses of wine per week    Comment: 1 glass red wine each night with dinner  . Drug use: No  . Sexual activity: Not on file  Lifestyle  . Physical activity    Days per week: Not on file    Minutes per session: Not on file  . Stress: Not on file  Relationships  . Social Herbalist on phone: Not on file    Gets together: Not on file    Attends religious service: Not on file    Active member of club or organization: Not on file    Attends meetings of clubs or organizations: Not on file    Relationship status: Not on file   . Intimate partner violence    Fear of current or ex partner: Not on file    Emotionally abused: Not on file    Physically abused: Not on file    Forced sexual activity: Not on file  Other Topics Concern  . Not on file  Social History Narrative  . Not on file    Past Surgical Hx:  Past Surgical History:  Procedure Laterality Date  . APPENDECTOMY    . BACK SURGERY     LOWER  . CARDIAC SURGERY    . COLONOSCOPY  2020   X 2  . ESOPHAGOGASTRODUODENOSCOPY (EGD) WITH PROPOFOL N/A 02/17/2019   Procedure: ESOPHAGOGASTRODUODENOSCOPY (EGD) WITH PROPOFOL;  Surgeon: Owens Loffler  P, MD;  Location: [facility] ENDOSCOPY;  Service: Endoscopy;  Laterality: N/A;  . EUS N/A 02/17/2019   Procedure: UPPER ENDOSCOPIC ULTRASOUND (EUS) RADIAL;  Surgeon: Milus Banister, MD;  Location: [facility] ENDOSCOPY;  Service: Endoscopy;  Laterality: N/A;  . EYE SURGERY  07/2016   LEFT EYE CATARACT  . OVARY REMOVE    . SVT ABLATION  2013    Past Medical Hx:  Past Medical History:  Diagnosis Date  . Cataract    RIGHT EYE  . Complication of anesthesia AGE LATE 20'S   SLOW TO AWAKEN AFTER DERMOID CYST REMOVED FROM [place]  . Diabetes mellitus without complication ([place])    TYPE 2  . Endometrial cancer ([place]) 2019   SX DONE AND 5 DUCTS REMOVED  . Pancreatic lesion     Past Gynecological History:  SVD x 2 No LMP recorded. Patient has had a hysterectomy.  Family Hx:  Family History  Problem Relation Age of Onset  . Diabetes Mother   . Heart disease Father   . Breast cancer Sister   . Colon cancer Neg Hx     Review of Systems:  Constitutional  Feels well,    ENT Normal appearing ears and nares bilaterally Skin/Breast  No rash, sores, jaundice, itching, dryness Cardiovascular  No chest pain, shortness of breath, or edema  Pulmonary  No cough or wheeze.  Gastro Intestinal  No nausea, vomitting, or diarrhoea. No bright red blood per rectum, no abdominal pain, change in bowel movement, or  constipation.  Genito Urinary  No frequency, urgency, dysuria, no postmenopausal bleeding Musculo Skeletal  No myalgia, arthralgia, joint swelling or pain  Neurologic  No weakness, numbness, change in gait,  Psychology  No depression, anxiety, insomnia.   Vitals:  Blood pressure 117/77, pulse 88, temperature 97.6 F (36.4 C), temperature source Temporal, resp. rate 17, height '5\' 2"'  (1.575 m), weight 98 lb 11.2 oz (44.8 kg), SpO2 100 %.  Physical Exam: WD in NAD Neck  Supple NROM, without any enlargements.  Lymph Node Survey No cervical supraclavicular or inguinal adenopathy Cardiovascular  Pulse normal rate, regularity and rhythm. S1 and S2 normal.  Lungs  Clear to auscultation bilateraly, without wheezes/crackles/rhonchi. Good air movement.  Skin  No rash/lesions/breakdown  Psychiatry  Alert and oriented to person, place, and time  Abdomen  Normoactive bowel sounds, abdomen soft, non-tender and thin without evidence of hernia. No masses.  Back No CVA tenderness Genito Urinary  Normal external genitalia. + rectocele, shortened atrophic vagina with ring at mid point consistent with radiation changes.  Rectal  deferred Extremities  No bilateral cyanosis, clubbing or edema.   Thereasa Solo, MD  03/29/2019, 5:25 PM

## 2019-03-29 NOTE — Patient Instructions (Signed)
Please notify Dr Denman George at phone number [phone] if you notice vaginal bleeding, new pelvic or abdominal pains, bloating, feeling full easy, or a change in bladder or bowel function.   Please contact Dr Serita Grit office (at [phone]) in April, 2021 to request an appointment with her for August, 2021.

## 2019-04-20 DIAGNOSIS — Z85828 Personal history of other malignant neoplasm of skin: Secondary | ICD-10-CM | POA: Diagnosis not present

## 2019-04-20 DIAGNOSIS — D1801 Hemangioma of skin and subcutaneous tissue: Secondary | ICD-10-CM | POA: Diagnosis not present

## 2019-04-20 DIAGNOSIS — D225 Melanocytic nevi of trunk: Secondary | ICD-10-CM | POA: Diagnosis not present

## 2019-04-20 DIAGNOSIS — L821 Other seborrheic keratosis: Secondary | ICD-10-CM | POA: Diagnosis not present

## 2019-05-24 ENCOUNTER — Telehealth: Payer: Self-pay

## 2019-05-24 DIAGNOSIS — R933 Abnormal findings on diagnostic imaging of other parts of digestive tract: Secondary | ICD-10-CM

## 2019-05-24 NOTE — Telephone Encounter (Signed)
-----   Message from Timothy Lasso, RN sent at 02/21/2019 12:07 PM EDT ----- I will plan to repeat imaging with MRI/MRCP of the pancreas in 3 months to check for interval change.  My office will arrange.

## 2019-05-24 NOTE — Telephone Encounter (Signed)
Left message on machine to call back  

## 2019-05-25 NOTE — Telephone Encounter (Signed)
The pt has been advised via My Chart (pt has communicated via MY Chart)  You have been scheduled for an MRI at [facility]  on 06/06/19. Your appointment time is 9 am. Please arrive 15 minutes prior to your appointment time for registration purposes. Please make certain not to have anything to eat or drink 6 hours prior to your test. In addition, if you have any metal in your body, have a pacemaker or defibrillator, please be sure to let your ordering physician know. This test typically takes 45 minutes to 1 hour to complete. Should you need to reschedule, please call [phone] to do so.

## 2019-05-25 NOTE — Telephone Encounter (Signed)
Left message on machine to call back also message sent to My Chart

## 2019-05-25 NOTE — Telephone Encounter (Signed)
Pt returned call per this msg

## 2019-06-06 ENCOUNTER — Other Ambulatory Visit: Payer: Self-pay | Admitting: Gastroenterology

## 2019-06-06 ENCOUNTER — Other Ambulatory Visit: Payer: Self-pay

## 2019-06-06 ENCOUNTER — Ambulatory Visit (HOSPITAL_COMMUNITY)
Admission: RE | Admit: 2019-06-06 | Discharge: 2019-06-06 | Disposition: A | Discharge status: Home / Self Care | Payer: Medicare Other | Source: Ambulatory Visit | Attending: Gastroenterology | Admitting: Gastroenterology

## 2019-06-06 DIAGNOSIS — K8689 Other specified diseases of pancreas: Secondary | ICD-10-CM | POA: Diagnosis not present

## 2019-06-06 DIAGNOSIS — R933 Abnormal findings on diagnostic imaging of other parts of digestive tract: Secondary | ICD-10-CM

## 2019-06-06 DIAGNOSIS — K760 Fatty (change of) liver, not elsewhere classified: Secondary | ICD-10-CM | POA: Diagnosis not present

## 2019-06-06 LAB — POCT I-STAT CREATININE: Creatinine, Ser: 0.7 mg/dL (ref 0.44–1.00)

## 2019-06-06 IMAGING — MR MR ABDOMEN WO/W CM MRCP
11 of 19 series · 20 of 48 positions shown · IV contrast (gadavist)
Comparison: Abdominal MRI [DATE].

CLINICAL DATA: 68-year-old female with history of endometrial
carcinoma. Ductal dilatation in the pancreas noted on prior CT
examination. Follow-up study.



[Series 3: DWI b500 · axial · 6.0mm · 1.76mm/px · z∈[-137,+159]mm · 2 of 78 slices shown]
[im 1/78]
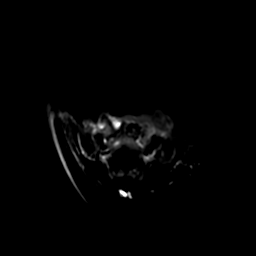
[im 78/78]
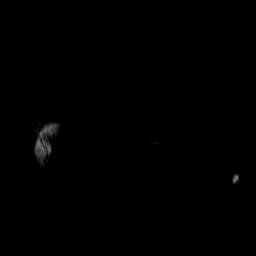

[Series 4: T2 fat-sat · axial · 5.0mm · 0.86mm/px · 1 of 54 slices shown]
[im 1/54]
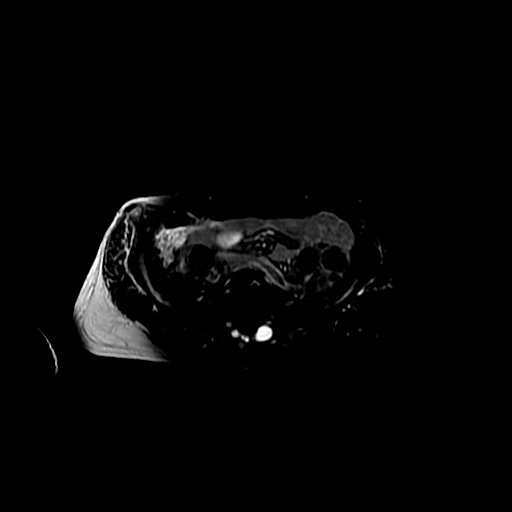

[Series 6: MRCP · coronal · 2.0mm · 0.70mm/px · 2 of 55 slices shown (1 of 2)]
[im 1/55]
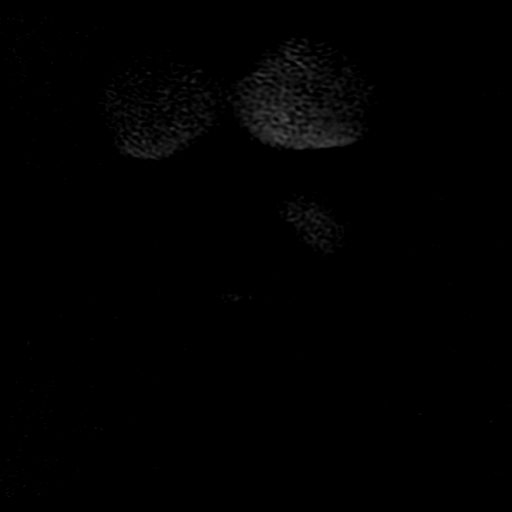
[im 55/55]
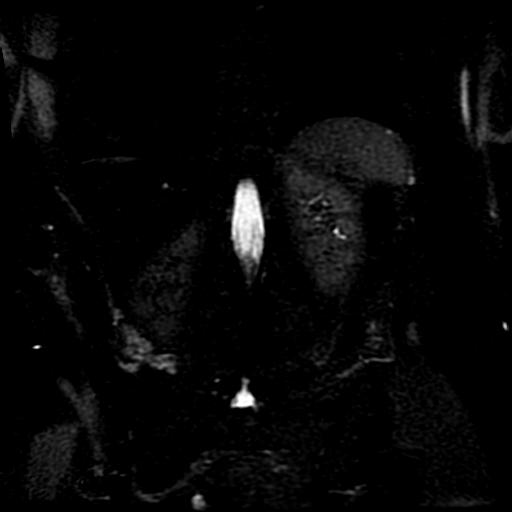

[Series 7: T2 · coronal · 5.0mm · 0.70mm/px · 1 of 31 slices shown (1 of 2)]
[im 1/31]
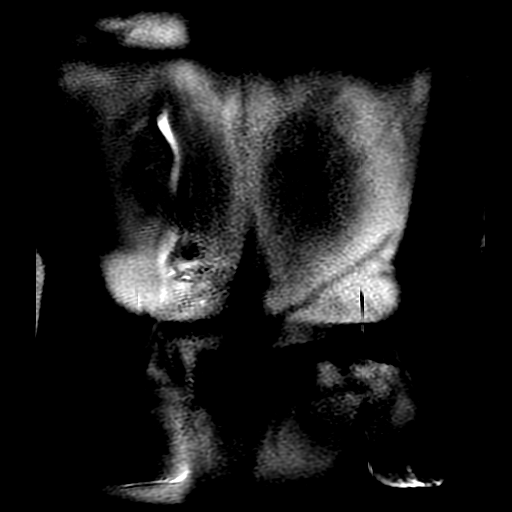

[Series 8: T2 · axial · 5.0mm · 0.86mm/px · z∈[-139,+161]mm · 2 of 61 slices shown (2 of 2)]
[im 1/61]
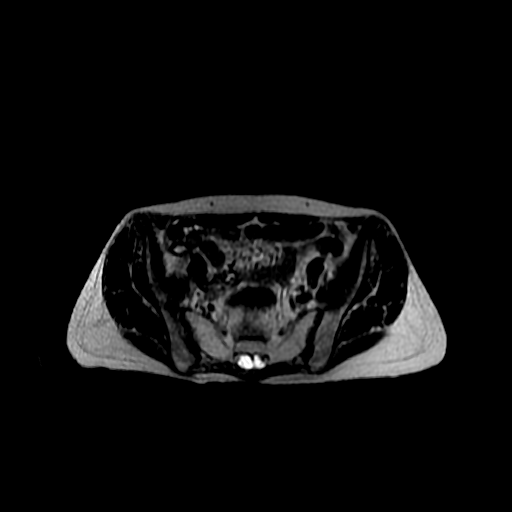
[im 61/61]
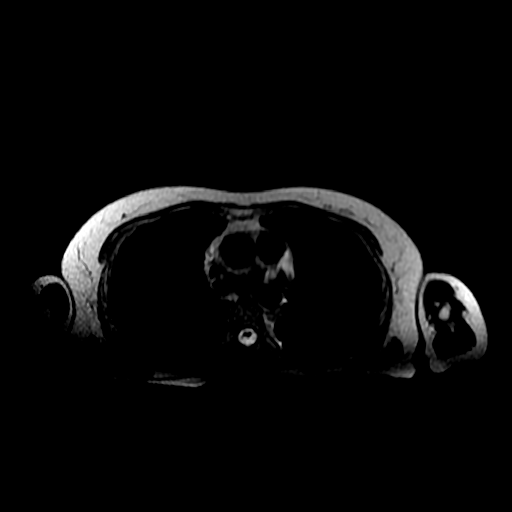

[Series 9: ax dualecho bh · axial · 5.0mm · 0.86mm/px · z∈[-139,+161]mm · 4 of 122 slices shown]
[im 1/122]
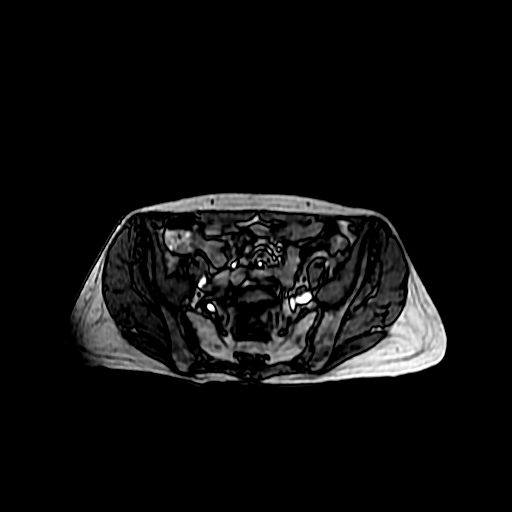
[im 41/122]
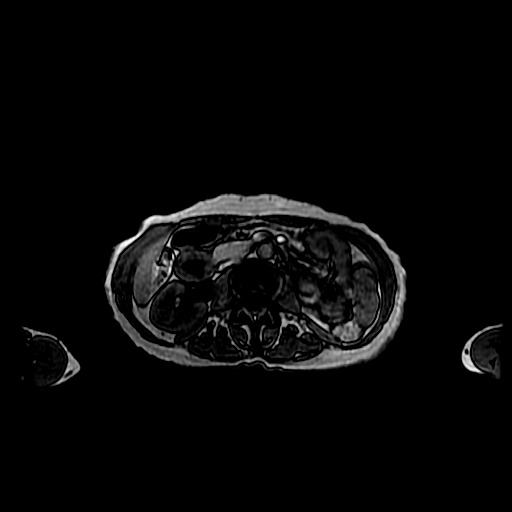
[im 81/122]
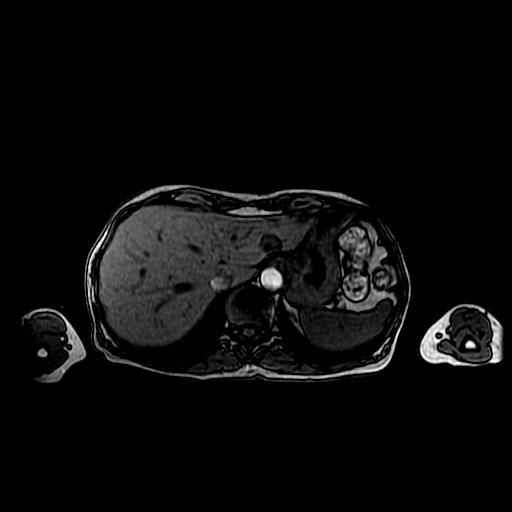
[im 122/122]
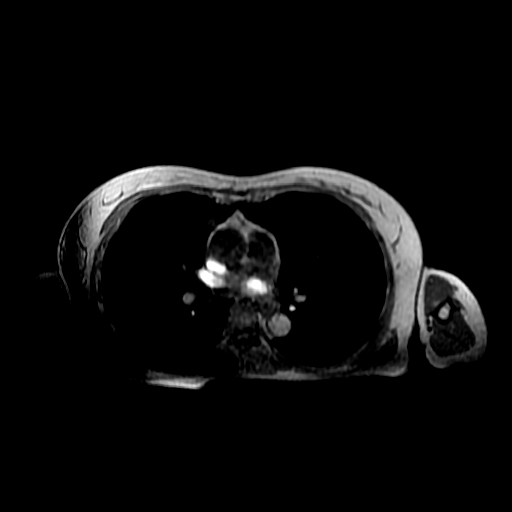

[Series 10: MRCP · coronal · 40.0mm · 0.70mm/px · 1 of 8 slices shown (2 of 2)]
[im 1/8]
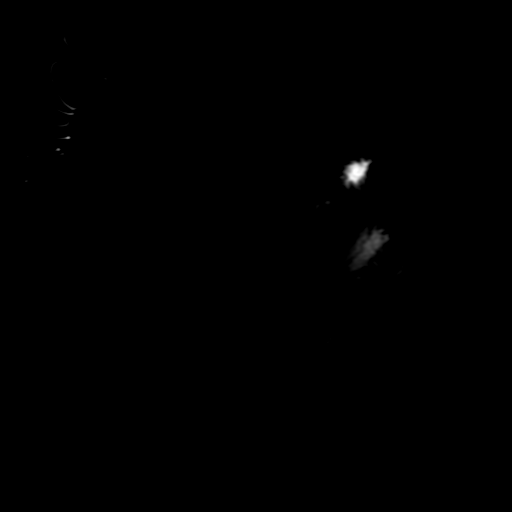

[Series 12: T1 dynamic · coronal · delayed · 5.0mm · 0.78mm/px · 2 of 64 slices shown (1 of 2)]
[im 1/64]
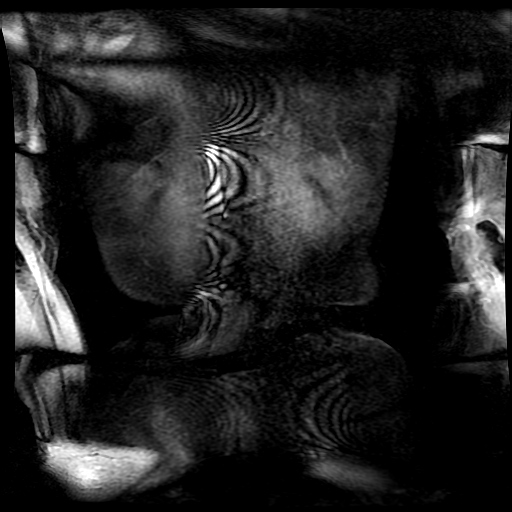
[im 64/64]
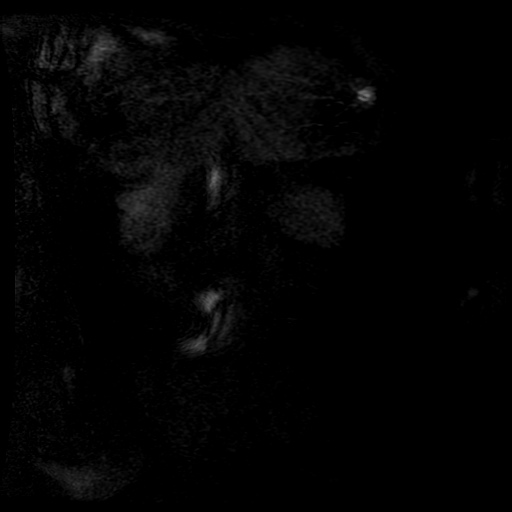

[Series 13: T1 dynamic · axial · 6.0mm · 0.78mm/px · z∈[-123,+138]mm · 3 of 88 slices shown (2 of 2)]
[im 1/88]
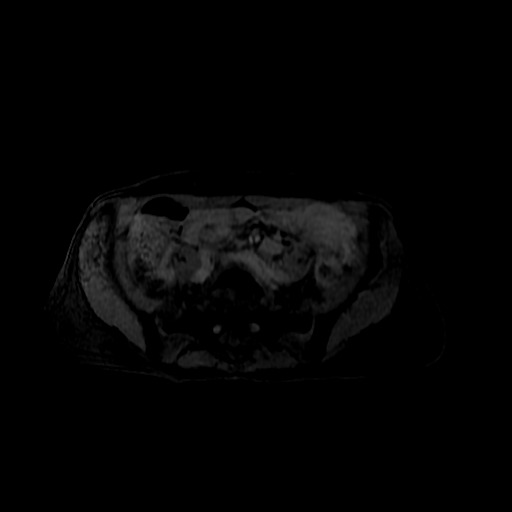
[im 44/88]
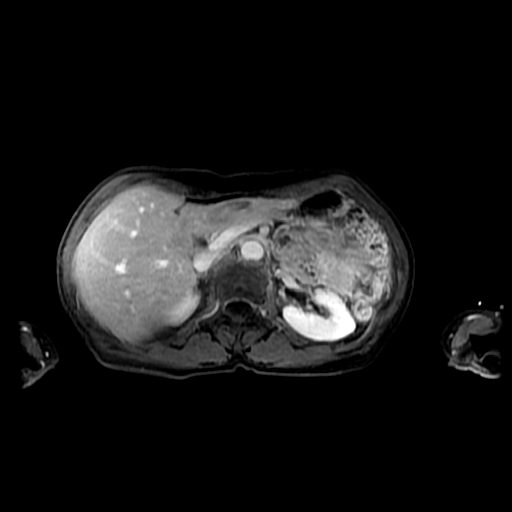
[im 88/88]
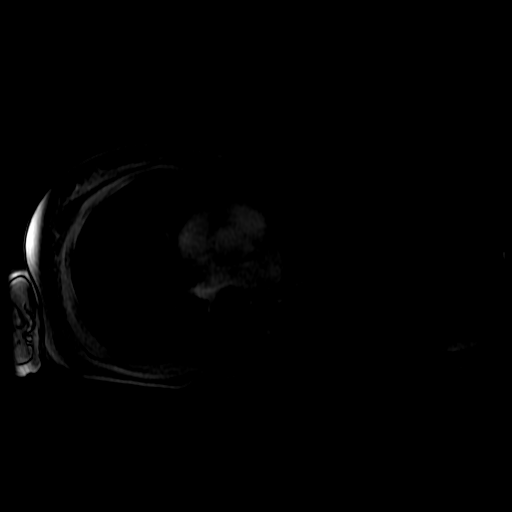

[Series 300: DWI · axial · 6.0mm · 1.76mm/px · 1 of 39 slices shown]
[im 1/39]
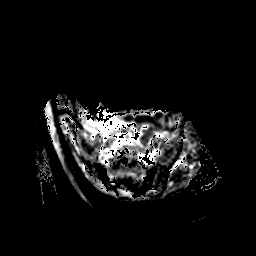

[Series 500: reformatted · axial · 1.6mm · 0.62mm/px · 1 of 108 slices shown]
[im 1/108]
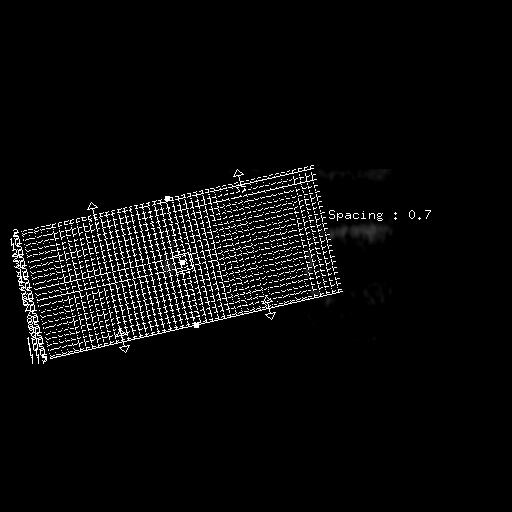

[20 of 48 positions shown; findings below may reference images not displayed]

FINDINGS: Lower chest: Visualized portions are unremarkable.

Hepatobiliary: Mild diffuse loss of signal intensity throughout the
hepatic parenchyma on out of phase dual echo images, indicative of
hepatic steatosis. No suspicious cystic or solid hepatic lesions. No
intra or extrahepatic biliary ductal dilatation. Gallbladder is
normal in appearance.

Pancreas: Again noted is dilatation of the pancreatic duct
throughout the distal body and tail of the pancreas measuring up to
5 mm. This abruptly terminates in the mid body of the pancreas. No
discrete mass confidently identified in this region. Several areas
of side branch ectasia are noted throughout the distal body and tail
of the pancreas. No definite pancreatic mass. No peripancreatic
fluid collections or inflammatory changes.

Spleen:  Unremarkable.

Adrenals/Urinary Tract: Bilateral kidneys and adrenal glands are
normal in appearance. No hydroureteronephrosis in the visualized
portions of the abdomen.

Stomach/Bowel: Unremarkable.

Vascular/Lymphatic: Aortic atherosclerosis. No aneurysm identified
in the visualized abdominal vasculature. No lymphadenopathy noted in
the abdomen.

Other: No significant volume of ascites noted in the visualized
portions of the peritoneal cavity.

Musculoskeletal: No aggressive appearing osseous lesions are noted
in the visualized portions of the skeleton.
IMPRESSION: 1. There is persistent ductal dilatation throughout the distal body
and tail of the pancreas which appears stable compared to prior
examinations. No discrete obstructing pancreatic mass is confidently
identified on today's examination. Given the stability of the
findings and absence of definite mass, a pancreatic stricture is
suspected.
2. Mild hepatic steatosis.

## 2019-06-06 MED ORDER — GADOBUTROL 1 MMOL/ML IV SOLN
5.0000 mL | Freq: Once | INTRAVENOUS | Status: AC | PRN
  Administered 2019-06-06: 5 mL via INTRAVENOUS

## 2019-07-06 NOTE — Progress Notes (Signed)
Radiation Oncology         [phone] ________________________________  Name: Kimberly [name] MRN: [id number]  Date: 07/07/2019  DOB: [birth year]  Follow-Up Visit Note  CC: Marton Redwood, MD  Marton Redwood, MD    ICD-10-CM   1. Endometrial cancer determined by uterine biopsy ([facility])  C54.1     Diagnosis:   69 y.o. 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 06/11/17 - 07/15/17 (external beam): Pelvis / 45 Gy in 25 fractions 2. 07/21/17 - 08/03/17 (brachytherapy): Vaginal cuff / 18 Gy in 3 fractions  Narrative:  The patient returns for routine follow up. She last saw Dr. Denman George on 03/29/2019 with no evidence of disease recurrence.  Since her last visit, she underwent workup for a subtle pancreatic lesion seen on MRI. She proceeded to upper endoscopy on 02/17/2019, which showed pancreatic duct ectasia without solid or cystic mass lesions. Follow up MRI on 06/06/2019 showed: persistent ductal dilatation, stable compared to prior studies; no definite discrete obstructing pancreatic mass; pancreatic stricture suspected.  On review of systems, she reports no new medical issues.  She denies any pelvic pain abdominal bloating vaginal bleeding or discharge.              ALLERGIES:  is allergic to sulfur.  Meds: Current Outpatient Medications  Medication Sig Dispense Refill  . Ascorbic Acid (VITAMIN C) 1000 MG tablet Take 1,000 mg by mouth daily.    Marland Kitchen aspirin EC 81 MG tablet Take 81 mg by mouth daily.    Marland Kitchen b complex vitamins tablet Take 1 tablet by mouth daily.    . Biotin 1000 MCG tablet Take 1,000 mcg by mouth daily.    . Ca Phosphate-Cholecalciferol (CALTRATE GUMMY BITES) 250-400 MG-UNIT CHEW Chew 3 tablets by mouth at bedtime.    . Calcium Carbonate-Vitamin D (CALTRATE 600+D PO) Take 1 tablet by  mouth daily.    . Cholecalciferol (VITAMIN D) 50 MCG (2000 UT) tablet Take 2,000 Units by mouth daily.    . diphenhydrAMINE (BENADRYL) 25 MG tablet Take 25 mg by mouth daily as needed for allergies.    . ferrous sulfate 325 (65 FE) MG tablet Take 325 mg by mouth daily with breakfast.    . latanoprost (XALATAN) 0.005 % ophthalmic solution Place 1 drop into both eyes at bedtime.     . metFORMIN (GLUCOPHAGE) 1000 MG tablet Take 1,000 mg by mouth 2 (two) times daily with a meal.    . Multiple Vitamin (MULTIVITAMIN WITH MINERALS) TABS tablet Take 1 tablet by mouth daily.    . rosuvastatin (CRESTOR) 10 MG tablet Take 10 mg by mouth at bedtime.     . tretinoin (RETIN-A) 0.1 % cream Apply 1 application topically 3 (three) times a week. At night    . zolpidem (AMBIEN CR) 12.5 MG CR tablet Take 12.5 mg by mouth at bedtime.      Current Facility-Administered Medications  Medication Dose Route Frequency Provider Last Rate Last Admin  . 0.9 %  sodium chloride infusion  500 mL Intravenous Continuous Milus Banister, MD        Physical Findings: The patient is in no acute distress. Patient is alert and oriented.  weight is 99 lb 6.4 oz (45.1 kg). Her temperature is 98.5 F (36.9 C). Her blood pressure is 120/65 and her pulse is 81.  Her respiration is 20 and oxygen saturation is 99%.  Lungs are clear to auscultation bilaterally. Heart has regular rate and rhythm. No palpable cervical, supraclavicular, or axillary adenopathy. Abdomen soft, non-tender, normal bowel sounds. On pelvic examination the external genitalia were unremarkable. A speculum exam was performed. There are no mucosal lesions noted in the vaginal vault. On bimanual and rectovaginal examination there were no pelvic masses appreciated.  Patient was noted to have a bandlike stricture in the mid vaginal area which is been noted on previous exams.  I was able to pass the small speculum past this strictured area.  Rectocele noted on exam  Lab  Findings: Lab Results  Component Value Date   WBC 4.7 12/29/2018   HGB 12.4 12/29/2018   HCT 36.6 12/29/2018   MCV 94.6 12/29/2018   PLT 269 12/29/2018    Radiographic Findings: No results found.  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  No signs of recurrence on pelvic exam today.  As above no issues regarding pancreatic evaluation  Plan:  Patient will follow up with Dr. Denman George in 3 months and with radiation oncology in 9 months since she will be over 2 years out from her treatment later this spring.  ____________________________________  Blair Promise, PhD, MD  This document serves as a record of services personally performed by Gery Pray, MD. It was created on his behalf by Wilburn Mylar, a trained medical scribe. The creation of this record is based on the scribe's personal observations and the provider's statements to them. This document has been checked and approved by the attending provider.

## 2019-07-07 ENCOUNTER — Other Ambulatory Visit: Payer: Self-pay

## 2019-07-07 ENCOUNTER — Ambulatory Visit
Admission: RE | Admit: 2019-07-07 | Discharge: 2019-07-07 | Disposition: A | Discharge status: Home / Self Care | Payer: Medicare Other | Source: Ambulatory Visit | Attending: Radiation Oncology | Admitting: Radiation Oncology

## 2019-07-07 ENCOUNTER — Encounter: Payer: Self-pay | Admitting: Radiation Oncology

## 2019-07-07 VITALS — BP 120/65 | HR 81 | Temp 98.5°F | Resp 20 | Wt 99.4 lb

## 2019-07-07 DIAGNOSIS — Z923 Personal history of irradiation: Secondary | ICD-10-CM | POA: Diagnosis not present

## 2019-07-07 DIAGNOSIS — Z7982 Long term (current) use of aspirin: Secondary | ICD-10-CM | POA: Insufficient documentation

## 2019-07-07 DIAGNOSIS — Z79899 Other long term (current) drug therapy: Secondary | ICD-10-CM | POA: Insufficient documentation

## 2019-07-07 DIAGNOSIS — Z8542 Personal history of malignant neoplasm of other parts of uterus: Secondary | ICD-10-CM | POA: Diagnosis not present

## 2019-07-07 DIAGNOSIS — C541 Malignant neoplasm of endometrium: Secondary | ICD-10-CM

## 2019-07-07 DIAGNOSIS — Z7984 Long term (current) use of oral hypoglycemic drugs: Secondary | ICD-10-CM | POA: Diagnosis not present

## 2019-07-07 DIAGNOSIS — K869 Disease of pancreas, unspecified: Secondary | ICD-10-CM | POA: Diagnosis not present

## 2019-07-07 DIAGNOSIS — Z08 Encounter for follow-up examination after completed treatment for malignant neoplasm: Secondary | ICD-10-CM | POA: Diagnosis not present

## 2019-07-07 NOTE — Patient Instructions (Signed)
Coronavirus (COVID-19) Are you at risk?  Are you at risk for the Coronavirus (COVID-19)?  To be considered HIGH RISK for Coronavirus (COVID-19), you have to meet the following criteria:  . Traveled to [place], [place], [place], [place] or [place]; or in the [place] to [place], [place], [place], or [place]; and have fever, cough, and shortness of breath within the last 2 weeks of travel OR . Been in close contact with a person diagnosed with COVID-19 within the last 2 weeks and have fever, cough, and shortness of breath . IF YOU DO NOT MEET THESE CRITERIA, YOU ARE CONSIDERED LOW RISK FOR COVID-19.  What to do if you are HIGH RISK for COVID-19?  . If you are having a medical emergency, call 911. . Seek medical care right away. Before you go to a doctor's office, urgent care or emergency department, call ahead and tell them about your recent travel, contact with someone diagnosed with COVID-19, and your symptoms. You should receive instructions from your physician's office regarding next steps of care.  . When you arrive at healthcare provider, tell the healthcare staff immediately you have returned from visiting [place], [place], [place], [place] or [place]; or traveled in the [place] to [place], [place], [place], or [place]; in the last two weeks or you have been in close contact with a person diagnosed with COVID-19 in the last 2 weeks.   . Tell the health care staff about your symptoms: fever, cough and shortness of breath. . After you have been seen by a medical provider, you will be either: o Tested for (COVID-19) and discharged home on quarantine except to seek medical care if symptoms worsen, and asked to  - Stay home and avoid contact with others until you get your results (4-5 days)  - Avoid travel on public transportation if possible (such as bus, train, or airplane) or o Sent to the Emergency Department by EMS for evaluation, COVID-19 testing, and possible  admission depending on your condition and test results.  What to do if you are LOW RISK for COVID-19?  Reduce your risk of any infection by using the same precautions used for avoiding the common cold or flu:  . Wash your hands often with soap and warm water for at least 20 seconds.  If soap and water are not readily available, use an alcohol-based hand sanitizer with at least 60% alcohol.  . If coughing or sneezing, cover your mouth and nose by coughing or sneezing into the elbow areas of your shirt or coat, into a tissue or into your sleeve (not your hands). . Avoid shaking hands with others and consider head nods or verbal greetings only. . Avoid touching your eyes, nose, or mouth with unwashed hands.  . Avoid close contact with people who are sick. . Avoid places or events with large numbers of people in one location, like concerts or sporting events. . Carefully consider travel plans you have or are making. . If you are planning any travel outside or inside the [place], visit the CDC's Travelers' Health webpage for the latest health notices. . If you have some symptoms but not all symptoms, continue to monitor at home and seek medical attention if your symptoms worsen. . If you are having a medical emergency, call 911.   ADDITIONAL HEALTHCARE OPTIONS FOR PATIENTS  [facility] / e-Visit: [email or web address]         [facility]: [phone]  [facility]: [phone]                   [facility]: [phone]   

## 2019-07-07 NOTE — Progress Notes (Signed)
Kimberly [name] presents today for f/u with Dr. Sondra Come. Pt denies c/o pain. Pt denies dysuria/hematuria. Pt denies vaginal bleeding/discharge. Pt denies rectal bleeding, diarrhea/constipation. Pt denies abdominal bloating, N/V.   BP 120/65 (BP Location: Left Arm, Patient Position: Sitting, Cuff Size: Normal)   Pulse 81   Temp 98.5 F (36.9 C)   Resp 20   Wt 99 lb 6.4 oz (45.1 kg)   SpO2 99%   BMI 18.18 kg/m   Wt Readings from Last 3 Encounters:  07/07/19 99 lb 6.4 oz (45.1 kg)  03/29/19 98 lb 11.2 oz (44.8 kg)  02/17/19 97 lb (44 kg)   Loma Sousa, RN BSN

## 2019-08-24 DIAGNOSIS — Z961 Presence of intraocular lens: Secondary | ICD-10-CM | POA: Diagnosis not present

## 2019-08-24 DIAGNOSIS — H25811 Combined forms of age-related cataract, right eye: Secondary | ICD-10-CM | POA: Diagnosis not present

## 2019-08-24 DIAGNOSIS — H401132 Primary open-angle glaucoma, bilateral, moderate stage: Secondary | ICD-10-CM | POA: Diagnosis not present

## 2019-08-29 DIAGNOSIS — Z1231 Encounter for screening mammogram for malignant neoplasm of breast: Secondary | ICD-10-CM | POA: Diagnosis not present

## 2019-09-21 ENCOUNTER — Ambulatory Visit (INDEPENDENT_AMBULATORY_CARE_PROVIDER_SITE_OTHER): Payer: Medicare Other | Admitting: Podiatry

## 2019-09-21 ENCOUNTER — Other Ambulatory Visit: Payer: Self-pay

## 2019-09-21 ENCOUNTER — Encounter: Payer: Self-pay | Admitting: Podiatry

## 2019-09-21 DIAGNOSIS — M79675 Pain in left toe(s): Secondary | ICD-10-CM | POA: Diagnosis not present

## 2019-09-21 DIAGNOSIS — B351 Tinea unguium: Secondary | ICD-10-CM

## 2019-09-21 DIAGNOSIS — E119 Type 2 diabetes mellitus without complications: Secondary | ICD-10-CM

## 2019-09-21 DIAGNOSIS — L84 Corns and callosities: Secondary | ICD-10-CM

## 2019-09-21 DIAGNOSIS — M79674 Pain in right toe(s): Secondary | ICD-10-CM | POA: Diagnosis not present

## 2019-09-21 NOTE — Patient Instructions (Signed)
Diabetes Mellitus and Foot Care Foot care is an important part of your health, especially when you have diabetes. Diabetes may cause you to have problems because of poor blood flow (circulation) to your feet and legs, which can cause your skin to:  Become thinner and drier.  Break more easily.  Heal more slowly.  Peel and crack. You may also have nerve damage (neuropathy) in your legs and feet, causing decreased feeling in them. This means that you may not notice minor injuries to your feet that could lead to more serious problems. Noticing and addressing any potential problems early is the best way to prevent future foot problems. How to care for your feet Foot hygiene  Wash your feet daily with warm water and mild soap. Do not use hot water. Then, pat your feet and the areas between your toes until they are completely dry. Do not soak your feet as this can dry your skin.  Trim your toenails straight across. Do not dig under them or around the cuticle. File the edges of your nails with an emery board or nail file.  Apply a moisturizing lotion or petroleum jelly to the skin on your feet and to dry, brittle toenails. Use lotion that does not contain alcohol and is unscented. Do not apply lotion between your toes. Shoes and socks  Wear clean socks or stockings every day. Make sure they are not too tight. Do not wear knee-high stockings since they may decrease blood flow to your legs.  Wear shoes that fit properly and have enough cushioning. Always look in your shoes before you put them on to be sure there are no objects inside.  To break in new shoes, wear them for just a few hours a day. This prevents injuries on your feet. Wounds, scrapes, corns, and calluses  Check your feet daily for blisters, cuts, bruises, sores, and redness. If you cannot see the bottom of your feet, use a mirror or ask someone for help.  Do not cut corns or calluses or try to remove them with medicine.  If you  find a minor scrape, cut, or break in the skin on your feet, keep it and the skin around it clean and dry. You may clean these areas with mild soap and water. Do not clean the area with peroxide, alcohol, or iodine.  If you have a wound, scrape, corn, or callus on your foot, look at it several times a day to make sure it is healing and not infected. Check for: ? Redness, swelling, or pain. ? Fluid or blood. ? Warmth. ? Pus or a bad smell. General instructions  Do not cross your legs. This may decrease blood flow to your feet.  Do not use heating pads or hot water bottles on your feet. They may burn your skin. If you have lost feeling in your feet or legs, you may not know this is happening until it is too late.  Protect your feet from hot and cold by wearing shoes, such as at the beach or on hot pavement.  Schedule a complete foot exam at least once a year (annually) or more often if you have foot problems. If you have foot problems, report any cuts, sores, or bruises to your health care provider immediately. Contact a health care provider if:  You have a medical condition that increases your risk of infection and you have any cuts, sores, or bruises on your feet.  You have an injury that is not   healing.  You have redness on your legs or feet.  You feel burning or tingling in your legs or feet.  You have pain or cramps in your legs and feet.  Your legs or feet are numb.  Your feet always feel cold.  You have pain around a toenail. Get help right away if:  You have a wound, scrape, corn, or callus on your foot and: ? You have pain, swelling, or redness that gets worse. ? You have fluid or blood coming from the wound, scrape, corn, or callus. ? Your wound, scrape, corn, or callus feels warm to the touch. ? You have pus or a bad smell coming from the wound, scrape, corn, or callus. ? You have a fever. ? You have a red line going up your leg. Summary  Check your feet every day  for cuts, sores, red spots, swelling, and blisters.  Moisturize feet and legs daily.  Wear shoes that fit properly and have enough cushioning.  If you have foot problems, report any cuts, sores, or bruises to your health care provider immediately.  Schedule a complete foot exam at least once a year (annually) or more often if you have foot problems. This information is not intended to replace advice given to you by your health care provider. Make sure you discuss any questions you have with your health care provider. Document Revised: 01/19/2019 Document Reviewed: 05/30/2016 Elsevier Patient Education  2020 [employer]  

## 2019-09-30 ENCOUNTER — Telehealth: Payer: Self-pay | Admitting: *Deleted

## 2019-09-30 NOTE — Telephone Encounter (Signed)
Called the patient and scheduled her for a follow up in August

## 2019-09-30 NOTE — Progress Notes (Signed)
Subjective: Kimberly [name] is a 69 y.o. female patient seen today preventative diabetic foot care and callus(es) right great toe and painful mycotic toenails b/l that are difficult to trim. Pain interferes with ambulation. Aggravating factors include wearing enclosed shoe gear. Pain is relieved with periodic professional debridement.   She states a new callus developed after she wore a new pair of Birkenstock sandals. She denies any redness, drainage or swelling.  Patient Active Problem List   Diagnosis Date Noted  . Dilation of pancreatic duct   . Counseling and coordination of care 05/14/2017  . Endometrial cancer determined by uterine biopsy ([place]) 04/01/2017    Current Outpatient Medications on File Prior to Visit  Medication Sig Dispense Refill  . Ascorbic Acid (VITAMIN C) 1000 MG tablet Take 1,000 mg by mouth daily.    Marland Kitchen aspirin EC 81 MG tablet Take 81 mg by mouth daily.    Marland Kitchen b complex vitamins tablet Take 1 tablet by mouth daily.    . Biotin 1000 MCG tablet Take 1,000 mcg by mouth daily.    . Ca Phosphate-Cholecalciferol (CALTRATE GUMMY BITES) 250-400 MG-UNIT CHEW Chew 3 tablets by mouth at bedtime.    . Calcium Carbonate-Vitamin D (CALTRATE 600+D PO) Take 1 tablet by mouth daily.    . Cholecalciferol (VITAMIN D) 50 MCG (2000 UT) tablet Take 2,000 Units by mouth daily.    . diphenhydrAMINE (BENADRYL) 25 MG tablet Take 25 mg by mouth daily as needed for allergies.    . ferrous sulfate 325 (65 FE) MG tablet Take 325 mg by mouth daily with breakfast.    . latanoprost (XALATAN) 0.005 % ophthalmic solution Place 1 drop into both eyes at bedtime.     . metFORMIN (GLUCOPHAGE) 1000 MG tablet Take 1,000 mg by mouth 2 (two) times daily with a meal.    . Multiple Vitamin (MULTIVITAMIN WITH MINERALS) TABS tablet Take 1 tablet by mouth daily.    . rosuvastatin (CRESTOR) 10 MG tablet Take 10 mg by mouth at bedtime.     . tretinoin (RETIN-A) 0.1 % cream Apply 1 application topically 3 (three) times  a week. At night    . zolpidem (AMBIEN CR) 12.5 MG CR tablet Take 12.5 mg by mouth at bedtime.      Current Facility-Administered Medications on File Prior to Visit  Medication Dose Route Frequency Provider Last Rate Last Admin  . 0.9 %  sodium chloride infusion  500 mL Intravenous Continuous Milus Banister, MD        Allergies  Allergen Reactions  . Sulfur Hives    Objective: Physical Exam  General: Kimberly [name] is a pleasant 69 y.o.  Caucasian female, WD, WN in NAD. AAO x 3.   Vascular:  Capillary fill time to digits <3 seconds b/l. Palpable DP pulses b/l. Palpable PT pulses b/l. Pedal hair absent b/l Skin temperature gradient within normal limits b/l. No edema noted b/l.  Dermatological:  Pedal skin with normal turgor, texture and tone bilaterally. No open wounds bilaterally. No interdigital macerations bilaterally. Toenails 1-5 left, R hallux, R 3rd toe, R 4th toe and R 5th toe elongated, dystrophic, thickened, and crumbly with subungual debris and tenderness to dorsal palpation. Anonychia noted R 2nd toe. Nailbed(s) epithelialized.  Hyperkeratotic lesion(s) R hallux.  No erythema, no edema, no drainage, no flocculence.  Musculoskeletal:  Normal muscle strength 5/5 to all lower extremity muscle groups bilaterally. No gross bony deformities bilaterally. No pain crepitus or joint limitation noted with ROM b/l.  Neurological:  Protective sensation intact 5/5 intact bilaterally with 10g monofilament b/l. Vibratory sensation intact b/l. Proprioception intact bilaterally.  Assessment and Plan:  1. Pain due to onychomycosis of toenails of both feet   2. Callus   3. Controlled type 2 diabetes mellitus without complication, without long-term current use of insulin ([place])    -Examined patient. -Continue diabetic foot care principles. Literature dispensed on today.  -Toenails 1-5 left, R hallux, R 3rd toe, R 4th toe and R 5th toe debrided in length and girth without iatrogenic  bleeding with sterile nail nipper and dremel.  -Callus(es) R hallux pared utilizing sterile scalpel blade without complication or incident. Total number debrided =1. -Patient to continue soft, supportive shoe gear daily. -Patient to report any pedal injuries to medical professional immediately. -Patient/POA to call should there be question/concern in the interim.  Return in about 3 months (around 12/22/2019) for diabetic nail and callus trim.  Marzetta Board, DPM

## 2019-12-22 ENCOUNTER — Encounter: Payer: Self-pay | Admitting: Gynecologic Oncology

## 2019-12-22 ENCOUNTER — Inpatient Hospital Stay: Payer: Medicare Other | Attending: Gynecologic Oncology | Admitting: Gynecologic Oncology

## 2019-12-22 ENCOUNTER — Other Ambulatory Visit: Payer: Self-pay

## 2019-12-22 VITALS — HR 82 | Temp 98.7°F | Resp 16 | Ht 62.0 in | Wt 97.5 lb

## 2019-12-22 DIAGNOSIS — Z7982 Long term (current) use of aspirin: Secondary | ICD-10-CM | POA: Insufficient documentation

## 2019-12-22 DIAGNOSIS — Z7984 Long term (current) use of oral hypoglycemic drugs: Secondary | ICD-10-CM | POA: Diagnosis not present

## 2019-12-22 DIAGNOSIS — Z8542 Personal history of malignant neoplasm of other parts of uterus: Secondary | ICD-10-CM | POA: Diagnosis not present

## 2019-12-22 DIAGNOSIS — E119 Type 2 diabetes mellitus without complications: Secondary | ICD-10-CM | POA: Insufficient documentation

## 2019-12-22 DIAGNOSIS — Z923 Personal history of irradiation: Secondary | ICD-10-CM | POA: Insufficient documentation

## 2019-12-22 DIAGNOSIS — Z08 Encounter for follow-up examination after completed treatment for malignant neoplasm: Secondary | ICD-10-CM | POA: Diagnosis not present

## 2019-12-22 DIAGNOSIS — Z90722 Acquired absence of ovaries, bilateral: Secondary | ICD-10-CM | POA: Diagnosis not present

## 2019-12-22 DIAGNOSIS — C541 Malignant neoplasm of endometrium: Secondary | ICD-10-CM

## 2019-12-22 DIAGNOSIS — Z79899 Other long term (current) drug therapy: Secondary | ICD-10-CM | POA: Insufficient documentation

## 2019-12-22 DIAGNOSIS — Z90710 Acquired absence of both cervix and uterus: Secondary | ICD-10-CM | POA: Diagnosis not present

## 2019-12-22 NOTE — Patient Instructions (Signed)
Please notify Dr Denman George at phone number [phone] if you notice vaginal bleeding, new pelvic or abdominal pains, bloating, feeling full easy, or a change in bladder or bowel function.   Please ask Dr Clabe Seal office contact Dr Serita Grit office (at [phone]) in January after your appointment with him to request an appointment with her for August, 2022.

## 2019-12-22 NOTE — Progress Notes (Signed)
Follow-up Note: Gyn-Onc  Consult was requested by Dr. Benjie Karvonen for the evaluation of Kimberly [name] 69 y.o. female  CC:  Chief Complaint  Patient presents with  . Endometrial cancer determined by uterine biopsy ([place])    Follow Up   Assessment/Plan:  Kimberly [name]  is a 69 y.o.  year old with stage IB grade 3 endometrioid endometrial cancer (MSI low/stable). Surgery December, 2018. S/p adjuvant external beam and brachytherapy completed March, 2019. No evidence of disease recurrence. I recommend continuing 6 monthly evaluations until March, 2024 Counseled regarding symtpoms of recurrence.  HPI: Kimberly [name] is a 69 year old P2 who is seen in consultation at the request of Dr Benjie Karvonen for grade 2 endometrial cancer.  The patient has a history of postmenopausal spotting since April, 2018. She was seen by a PA in her PCP's office in July, 2018 and a pap test was performed which per patient was negative and therefore no intervention followed. The patient continued to have spotting and informed her PCP, Dr Brigitte Pulse, of this in October during her annual physical. He then promptly referred her to Dr [facility] office 02/27/17 and a TVUS was performed which showed a uterus measuring normal dimensions and a 15m endometrial stripe.  On 02/28/17 an office pipelle biopsy was performed and revealed a FIGO grade 2 endometrioid endometrial cancer.  The patient has a history of a prior USO (? Right) many years ago for a benign cyst on the ovary. She has also had an open appendectomy remotely. She is thin (103lbs) and otherwise very healthy.  She has had L5 surgery through posterior approach.  Her sister has a history of breast and ovarian cancer, and while she was not tested, Ms Lormand underwent genetic testing for BRCA and MLH/MSH abnormalities which were unremarkable.    On 04/13/17 she underwent robotic assisted total hysterectomy BSO, SLN biopsy with Dr [facility]ind[facility] Final pathology revealed a deeply  invasive grade 3 endometrioid tumor with multifocal LVSI present and ITCs present in a left external iliac SLN. Assiged stage IB grade 3. MSI low (stable). Due to these high risk features, she was determined to be at high risk for recurrence and adjuvant external beam radiation with vaginal brachytherapy were recommended.   She went on to receive external beam and vaginal brachytherapy completed in February, 2019. She tolerated therapy well.  Interval Hx:  The patient received a CT scan of the abdomen and pelvis on December 29, 2018.  This showed no evidence of recurrent or metastatic endometrial cancer, however it did reveal mild pancreatic ductal dilation in the body and tail with a transition point in the pancreatic body.  She then underwent a follow-up MR of the pancreas which confirmed the ectatic pancreatic duct and endoscopic ultrasound was recommended.  In early October 2020 she underwent an endoscopic ultrasound scan which revealed no mass or cyst in the pancreatic tail just the ectatic duct.  No biopsies were performed.  Recommendation was made for follow-up MRI in 3 months. MRI in January, 2021 showed there is persistent ductal dilatation throughout the distal body and tail of the pancreas which appears stable compared to prior examinations. No discrete obstructing pancreatic mass is confidently identified on today's examination. Given the stability of the findings and absence of definite mass, a pancreatic stricture is suspected.  She is doing well without any complaints. She is not sexually active.   Current Meds:  Outpatient Encounter Medications as of 12/22/2019  Medication Sig  . Ascorbic  Acid (VITAMIN C) 1000 MG tablet Take 1,000 mg by mouth daily.  Marland Kitchen aspirin EC 81 MG tablet Take 81 mg by mouth daily.  Marland Kitchen b complex vitamins tablet Take 1 tablet by mouth daily.  . Biotin 1000 MCG tablet Take 1,000 mcg by mouth daily.  . Ca Phosphate-Cholecalciferol (CALTRATE GUMMY BITES) 250-400  MG-UNIT CHEW Chew 3 tablets by mouth at bedtime.  . Calcium Carbonate-Vitamin D (CALTRATE 600+D PO) Take 1 tablet by mouth daily.  . Cholecalciferol (VITAMIN D) 50 MCG (2000 UT) tablet Take 2,000 Units by mouth daily.  . diphenhydrAMINE (BENADRYL) 25 MG tablet Take 25 mg by mouth daily as needed for allergies.  . ferrous sulfate 325 (65 FE) MG tablet Take 325 mg by mouth daily with breakfast.  . latanoprost (XALATAN) 0.005 % ophthalmic solution Place 1 drop into both eyes at bedtime.   . metFORMIN (GLUCOPHAGE) 1000 MG tablet Take 500 mg by mouth 2 (two) times daily with a meal.   . Multiple Vitamin (MULTIVITAMIN WITH MINERALS) TABS tablet Take 1 tablet by mouth daily.  . rosuvastatin (CRESTOR) 10 MG tablet Take 10 mg by mouth at bedtime.   . tretinoin (RETIN-A) 0.1 % cream Apply 1 application topically 3 (three) times a week. At night  . zolpidem (AMBIEN CR) 12.5 MG CR tablet Take 12.5 mg by mouth at bedtime.    Facility-Administered Encounter Medications as of 12/22/2019  Medication  . 0.9 %  sodium chloride infusion    Allergy:  Allergies  Allergen Reactions  . Sulfur Hives    Social Hx:   Social History   Socioeconomic History  . Marital status: Divorced    Spouse name: Not on file  . Number of children: Not on file  . Years of education: Not on file  . Highest education level: Not on file  Occupational History  . Not on file  Tobacco Use  . Smoking status: Never Smoker  . Smokeless tobacco: Never Used  Vaping Use  . Vaping Use: Never used  Substance and Sexual Activity  . Alcohol use: Yes    Alcohol/week: 7.0 standard drinks    Types: 7 Glasses of wine per week    Comment: 1 glass red wine each night with dinner  . Drug use: No  . Sexual activity: Not on file  Other Topics Concern  . Not on file  Social History Narrative  . Not on file   Social Determinants of Health   Financial Resource Strain:   . Difficulty of Paying Living Expenses:   Food Insecurity:    . Worried About Charity fundraiser in the Last Year:   . Arboriculturist in the Last Year:   Transportation Needs:   . Film/video editor (Medical):   Marland Kitchen Lack of Transportation (Non-Medical):   Physical Activity:   . Days of Exercise per Week:   . Minutes of Exercise per Session:   Stress:   . Feeling of Stress :   Social Connections:   . Frequency of Communication with Friends and Family:   . Frequency of Social Gatherings with Friends and Family:   . Attends Religious Services:   . Active Member of Clubs or Organizations:   . Attends Archivist Meetings:   Marland Kitchen Marital Status:   Intimate Partner Violence:   . Fear of Current or Ex-Partner:   . Emotionally Abused:   Marland Kitchen Physically Abused:   . Sexually Abused:     Past Surgical Hx:  Past Surgical History:  Procedure Laterality Date  . APPENDECTOMY    . BACK SURGERY     LOWER  . CARDIAC SURGERY    . COLONOSCOPY  2020   X 2  . ESOPHAGOGASTRODUODENOSCOPY (EGD) WITH PROPOFOL N/A 02/17/2019   Procedure: ESOPHAGOGASTRODUODENOSCOPY (EGD) WITH PROPOFOL;  Surgeon: Milus Banister, MD;  Location: [facility];  Service: Endoscopy;  Laterality: N/A;  . EUS N/A 02/17/2019   Procedure: UPPER ENDOSCOPIC ULTRASOUND (EUS) RADIAL;  Surgeon: Milus Banister, MD;  Location: [facility];  Service: Endoscopy;  Laterality: N/A;  . EYE SURGERY  07/2016   LEFT EYE CATARACT  . OVARY REMOVE    . SVT ABLATION  2013    Past Medical Hx:  Past Medical History:  Diagnosis Date  . Cataract    RIGHT EYE  . Complication of anesthesia AGE LATE 20'S   SLOW TO AWAKEN AFTER DERMOID CYST REMOVED FROM [place]  . Diabetes mellitus without complication ([place])    TYPE 2  . Endometrial cancer ([place]) 2019   SX DONE AND 5 DUCTS REMOVED  . Pancreatic lesion     Past Gynecological History:  SVD x 2 No LMP recorded. Patient has had a hysterectomy.  Family Hx:  Family History  Problem Relation Age of Onset  . Diabetes Mother    . Heart disease Father   . Breast cancer Sister   . Colon cancer Neg Hx     Review of Systems:  Constitutional  Feels well,    ENT Normal appearing ears and nares bilaterally Skin/Breast  No rash, sores, jaundice, itching, dryness Cardiovascular  No chest pain, shortness of breath, or edema  Pulmonary  No cough or wheeze.  Gastro Intestinal  No nausea, vomitting, or diarrhoea. No bright red blood per rectum, no abdominal pain, change in bowel movement, or constipation.  Genito Urinary  No frequency, urgency, dysuria, no postmenopausal bleeding Musculo Skeletal  No myalgia, arthralgia, joint swelling or pain  Neurologic  No weakness, numbness, change in gait,  Psychology  No depression, anxiety, insomnia.   Vitals:  Pulse 82, temperature 98.7 F (37.1 C), temperature source Oral, resp. rate 16, height '5\' 2"'  (1.575 m), weight 97 lb 8 oz (44.2 kg), SpO2 100 %.  Physical Exam: WD in NAD Neck  Supple NROM, without any enlargements.  Lymph Node Survey No cervical supraclavicular or inguinal adenopathy Cardiovascular  Pulse normal rate, regularity and rhythm. S1 and S2 normal.  Lungs  Clear to auscultation bilateraly, without wheezes/crackles/rhonchi. Good air movement.  Skin  No rash/lesions/breakdown  Psychiatry  Alert and oriented to person, place, and time  Abdomen  Normoactive bowel sounds, abdomen soft, non-tender and thin without evidence of hernia. No masses.  Back No CVA tenderness Genito Urinary  Normal external genitalia. + rectocele, shortened atrophic vagina with ring at mid point consistent with radiation changes.  Rectal  deferred Extremities  No bilateral cyanosis, clubbing or edema.   Thereasa Solo, MD  12/22/2019, 2:09 PM

## 2019-12-23 ENCOUNTER — Ambulatory Visit: Payer: Medicare Other | Admitting: Podiatry

## 2020-02-08 DIAGNOSIS — M859 Disorder of bone density and structure, unspecified: Secondary | ICD-10-CM | POA: Diagnosis not present

## 2020-02-08 DIAGNOSIS — E785 Hyperlipidemia, unspecified: Secondary | ICD-10-CM | POA: Diagnosis not present

## 2020-02-08 DIAGNOSIS — E119 Type 2 diabetes mellitus without complications: Secondary | ICD-10-CM | POA: Diagnosis not present

## 2020-02-15 DIAGNOSIS — Z1331 Encounter for screening for depression: Secondary | ICD-10-CM | POA: Diagnosis not present

## 2020-02-15 DIAGNOSIS — Z1339 Encounter for screening examination for other mental health and behavioral disorders: Secondary | ICD-10-CM | POA: Diagnosis not present

## 2020-02-15 DIAGNOSIS — Z23 Encounter for immunization: Secondary | ICD-10-CM | POA: Diagnosis not present

## 2020-04-04 DIAGNOSIS — Z1212 Encounter for screening for malignant neoplasm of rectum: Secondary | ICD-10-CM | POA: Diagnosis not present

## 2020-04-08 NOTE — Progress Notes (Signed)
Radiation Oncology         [phone]) [phone] ________________________________  Name: Kimberly [name] MRN: [id number]  Date: 04/09/2020  DOB: 02-10-1951  Follow-Up Visit Note  CC: Marton Redwood, MD  Marton Redwood, MD    ICD-10-CM   1. Endometrial cancer determined by uterine biopsy ([place])  C54.1     Diagnosis:  Stage IB, grade 3 endometrioid adenocarcinoma of the endometrium, multifocal lymphovascular space invasion, isolated tumor cells within left external iliac lymph node chain  Interval Since Last Radiation: Two years, eight months, and four days  1. 06/11/17 - 07/15/17 (external beam): Pelvis / 45 Gy in 25 fractions  2. 07/21/17 - 08/03/17 (brachytherapy): Vaginal cuff / 18 Gy in 3 fractions  Narrative:  The patient returns for routine follow up. She last saw Dr. Denman George on 12/22/2019, during which time there was no evidence of disease recurrence.  On review of systems, she reports No pelvic pain vaginal bleeding or discharge. She denies abdominal bloating or appetite problems.  He has noticed over approximately 1 month since October 30 is some weakness in her left lower extremity.  This makes it difficult for her to ambulate up stairs.  She also reports having pain in the left upper medial thigh.  She has had numbness in both upper medial thighs ever since her surgery pain as above is a new issue for her.  She denies any numbness in the left lower extremity.  She denies any headaches visual problems.  She denies ever having any lumbar spine issues.  Up until this new problem the patient was walking approximately 2-1/2 to 3 miles per day for exercise              ALLERGIES:  is allergic to sulfur.  Meds: Current Outpatient Medications  Medication Sig Dispense Refill  . Ascorbic Acid (VITAMIN C) 1000 MG tablet Take 1,000 mg by mouth daily.    Marland Kitchen b complex vitamins tablet Take 1 tablet by mouth daily.    . Biotin 1000 MCG tablet Take 1,000 mcg by mouth daily.    . Ca  Phosphate-Cholecalciferol (CALTRATE GUMMY BITES) 250-400 MG-UNIT CHEW Chew 3 tablets by mouth at bedtime.    . Calcium Carbonate-Vitamin D (CALTRATE 600+D PO) Take 1 tablet by mouth daily.    . Cholecalciferol (VITAMIN D) 50 MCG (2000 UT) tablet Take 2,000 Units by mouth daily.    . diphenhydrAMINE (BENADRYL) 25 MG tablet Take 25 mg by mouth daily as needed for allergies.    . ferrous sulfate 325 (65 FE) MG tablet Take 325 mg by mouth daily with breakfast.    . latanoprost (XALATAN) 0.005 % ophthalmic solution Place 1 drop into both eyes at bedtime.     . metFORMIN (GLUCOPHAGE) 1000 MG tablet Take 500 mg by mouth 2 (two) times daily with a meal.     . Multiple Vitamin (MULTIVITAMIN WITH MINERALS) TABS tablet Take 1 tablet by mouth daily.    . rosuvastatin (CRESTOR) 10 MG tablet Take 10 mg by mouth at bedtime.     . tretinoin (RETIN-A) 0.1 % cream Apply 1 application topically 3 (three) times a week. At night    . zolpidem (AMBIEN CR) 12.5 MG CR tablet Take 12.5 mg by mouth at bedtime.      Current Facility-Administered Medications  Medication Dose Route Frequency Provider Last Rate Last Admin  . 0.9 %  sodium chloride infusion  500 mL Intravenous Continuous Milus Banister, MD  Physical Findings: The patient is in no acute distress. Patient is alert and oriented.  height is 5\' 2"  (1.575 m) and weight is 104 lb (47.2 kg). Her temperature is 97.7 F (36.5 C). Her blood pressure is 140/67 and her pulse is 88. Her respiration is 16 and oxygen saturation is 98%.  Lungs are clear to auscultation bilaterally. Heart has regular rate and rhythm. No palpable cervical, supraclavicular, or axillary adenopathy. Abdomen soft, non-tender, normal bowel sounds. On pelvic examination the external genitalia were unremarkable. A speculum exam was performed. There are no mucosal lesions noted in the vaginal vault. On bimanual  examination there were no pelvic masses appreciated. Patient was noted to have a  bandlike stricture in the mid vaginal area which is been noted on previous exams.  I was able to pass the small speculum past this strictured area. Rectocele noted on exam.  The patient on exam ports pain in the left upper medial thigh.  I am unable to palpate any mass in this area.  She does not have any palpable lymphadenopathy in the left inguinal region.  She does have weakness in the proximal group of the left lower extremity, 3 out of 5.  She is able to lift her left lower extremity against gravity.  Distal muscle strength in the left lower extremity and right lower extremity is 5 out of 5.  Dorsiflexion and plantar flexion are good in both extremities  Lab Findings: Lab Results  Component Value Date   WBC 4.7 12/29/2018   HGB 12.4 12/29/2018   HCT 36.6 12/29/2018   MCV 94.6 12/29/2018   PLT 269 12/29/2018    Radiographic Findings: No results found.  Impression: Stage IB, grade 3 endometrioid adenocarcinoma of the endometrium, multifocal lymphovascular space invasion, isolated tumor cells within left external iliac lymph node chain.  No signs of recurrence on pelvic exam today.  The etiology of the patient's left leg weakness is unknown.  She denies any injury to the left pelvis area.  Onset seems abrupt as of October 30.  I doubt this is related to her cancer but offered to order a CT scan of the abdomen and pelvis as part of the work-up.  She would like to speak with her primary care physician Dr. Brigitte Pulse before scheduling any scans.  Plan:  The patient will follow up with Dr. Denman George in six months and with radiation oncology in one year.  As above she will follow up with Dr. Brigitte Pulse for work-up of her left leg weakness.  Total time spent in this encounter was 25 minutes which included reviewing the patient's most recent follow-up with Dr. Denman George, physical examination, and documentation.  _____________________________  Blair Promise, PhD, MD  This document serves as a record of services  personally performed by Gery Pray, MD. It was created on his behalf by Clerance Lav, a trained medical scribe. The creation of this record is based on the scribe's personal observations and the provider's statements to them. This document has been checked and approved by the attending provider.

## 2020-04-09 ENCOUNTER — Other Ambulatory Visit: Payer: Self-pay

## 2020-04-09 ENCOUNTER — Ambulatory Visit
Admission: RE | Admit: 2020-04-09 | Discharge: 2020-04-09 | Disposition: A | Discharge status: Home / Self Care | Payer: Medicare Other | Source: Ambulatory Visit | Attending: Radiation Oncology | Admitting: Radiation Oncology

## 2020-04-09 VITALS — BP 140/67 | HR 88 | Temp 97.7°F | Resp 16 | Ht 62.0 in | Wt 104.0 lb

## 2020-04-09 DIAGNOSIS — Z08 Encounter for follow-up examination after completed treatment for malignant neoplasm: Secondary | ICD-10-CM | POA: Diagnosis not present

## 2020-04-09 DIAGNOSIS — Z8542 Personal history of malignant neoplasm of other parts of uterus: Secondary | ICD-10-CM | POA: Diagnosis not present

## 2020-04-09 DIAGNOSIS — C541 Malignant neoplasm of endometrium: Secondary | ICD-10-CM

## 2020-04-09 NOTE — Progress Notes (Signed)
Patient is here today for 9 month follow up.  Denies having any blood in urine or stool.  Reports issues with left leg groin area and in ability to raise leg fully.    She reports not using the dilator at home.  Last follow up with Dr. Denman George in August 2021 with no issues.  Medications reviewed and corrected.  Vitals:   04/09/20 1001  BP: 140/67  Pulse: 88  Resp: 16  Temp: 97.7 F (36.5 C)  SpO2: 98%

## 2020-04-11 DIAGNOSIS — R29898 Other symptoms and signs involving the musculoskeletal system: Secondary | ICD-10-CM | POA: Diagnosis not present

## 2020-04-11 DIAGNOSIS — M5416 Radiculopathy, lumbar region: Secondary | ICD-10-CM | POA: Diagnosis not present

## 2020-04-11 DIAGNOSIS — M25552 Pain in left hip: Secondary | ICD-10-CM | POA: Diagnosis not present

## 2020-04-11 DIAGNOSIS — Z8542 Personal history of malignant neoplasm of other parts of uterus: Secondary | ICD-10-CM | POA: Diagnosis not present

## 2020-04-12 ENCOUNTER — Other Ambulatory Visit: Payer: Self-pay | Admitting: Internal Medicine

## 2020-04-12 DIAGNOSIS — M5416 Radiculopathy, lumbar region: Secondary | ICD-10-CM

## 2020-04-12 DIAGNOSIS — M25552 Pain in left hip: Secondary | ICD-10-CM

## 2020-04-12 DIAGNOSIS — Z8542 Personal history of malignant neoplasm of other parts of uterus: Secondary | ICD-10-CM

## 2020-04-18 ENCOUNTER — Other Ambulatory Visit (HOSPITAL_BASED_OUTPATIENT_CLINIC_OR_DEPARTMENT_OTHER): Payer: Self-pay | Admitting: Internal Medicine

## 2020-04-18 DIAGNOSIS — Z8542 Personal history of malignant neoplasm of other parts of uterus: Secondary | ICD-10-CM

## 2020-04-18 DIAGNOSIS — E119 Type 2 diabetes mellitus without complications: Secondary | ICD-10-CM | POA: Diagnosis not present

## 2020-04-18 DIAGNOSIS — M25552 Pain in left hip: Secondary | ICD-10-CM

## 2020-04-19 ENCOUNTER — Ambulatory Visit (HOSPITAL_BASED_OUTPATIENT_CLINIC_OR_DEPARTMENT_OTHER)
Admission: RE | Admit: 2020-04-19 | Discharge: 2020-04-19 | Disposition: A | Discharge status: Home / Self Care | Payer: Medicare Other | Source: Ambulatory Visit | Attending: Internal Medicine | Admitting: Internal Medicine

## 2020-04-19 ENCOUNTER — Encounter (HOSPITAL_BASED_OUTPATIENT_CLINIC_OR_DEPARTMENT_OTHER): Payer: Self-pay

## 2020-04-19 ENCOUNTER — Other Ambulatory Visit: Payer: Self-pay

## 2020-04-19 DIAGNOSIS — M4319 Spondylolisthesis, multiple sites in spine: Secondary | ICD-10-CM | POA: Diagnosis not present

## 2020-04-19 DIAGNOSIS — M25552 Pain in left hip: Secondary | ICD-10-CM | POA: Insufficient documentation

## 2020-04-19 DIAGNOSIS — Z8542 Personal history of malignant neoplasm of other parts of uterus: Secondary | ICD-10-CM | POA: Diagnosis not present

## 2020-04-19 DIAGNOSIS — K8689 Other specified diseases of pancreas: Secondary | ICD-10-CM | POA: Diagnosis not present

## 2020-04-19 IMAGING — CT CT ABD-PELV W/ CM
2 of 5 series · 16 of 46 positions shown, 18 images · IV contrast (Omnipaque)
Comparison: [DATE] abdominopelvic CT. Abdominal MRI of
[DATE].

CLINICAL DATA: Left hip pain since [REDACTED]. History of endometrial
cancer with surgery, chemotherapy.

EXAM:
CT ABDOMEN AND PELVIS WITH CONTRAST
TECHNIQUE: Multidetector CT imaging of the abdomen and pelvis was performed
using the standard protocol following bolus administration of
intravenous contrast.
CONTRAST:  100mL OMNIPAQUE IOHEXOL 300 MG/ML  SOLN

[Series 2: axial st · axial · 0.80mm/px · z∈[-442,-62]mm · 13 of 86 slices shown, 15 images]
[im 5/86  soft-tissue]
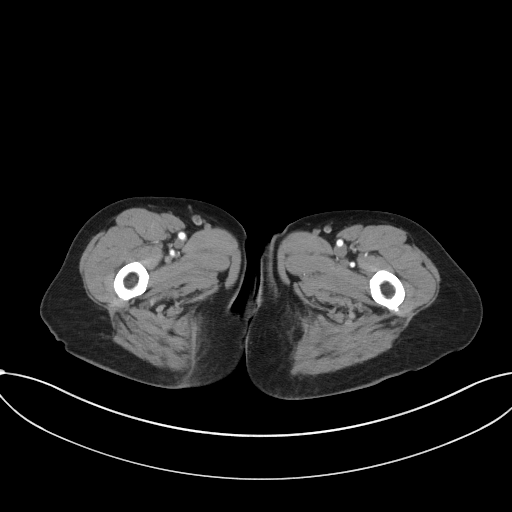
[im 5/86  bone]
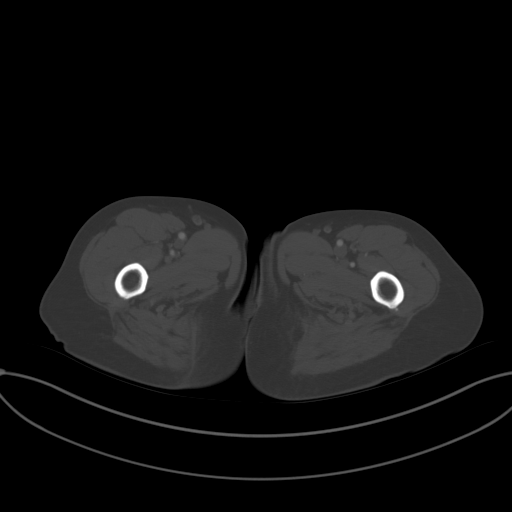
[im 14/86  soft-tissue]
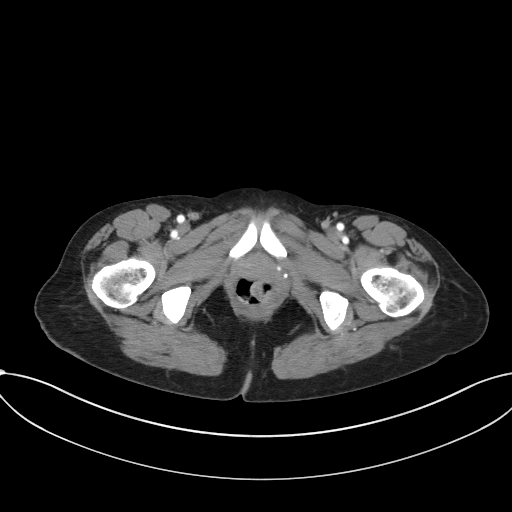
[im 18/86  soft-tissue]
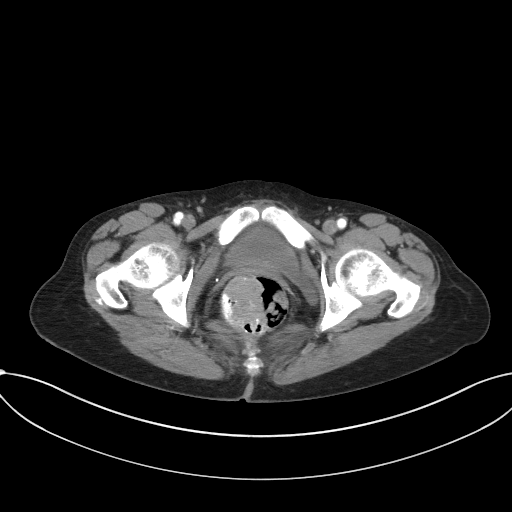
[im 23/86  soft-tissue]
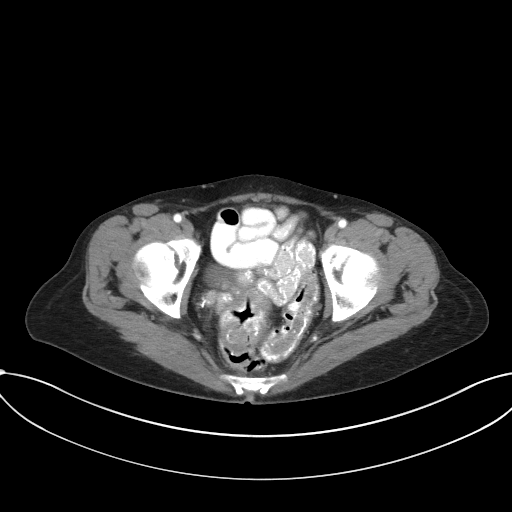
[im 32/86  soft-tissue]
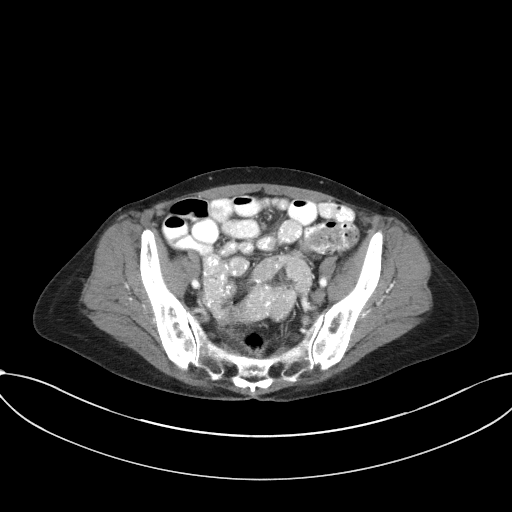
[im 36/86  soft-tissue]
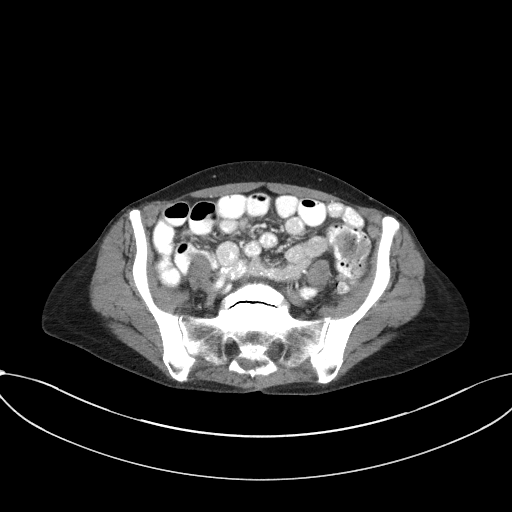
[im 45/86  soft-tissue]
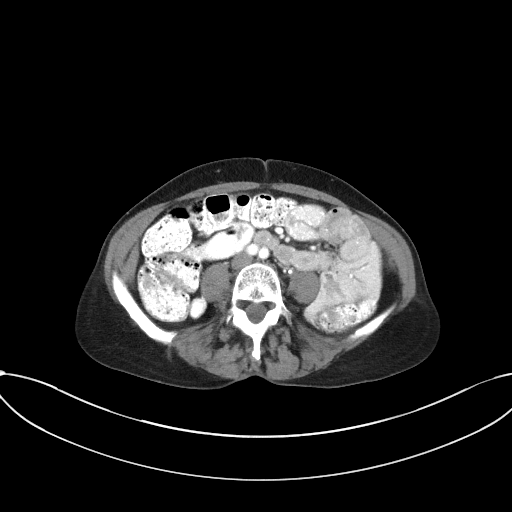
[im 50/86  soft-tissue]
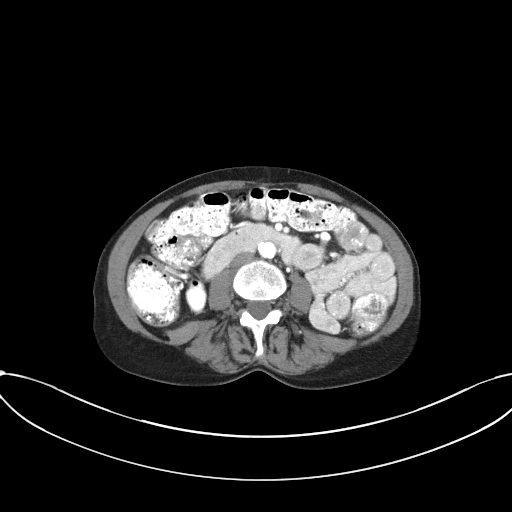
[im 54/86  soft-tissue]
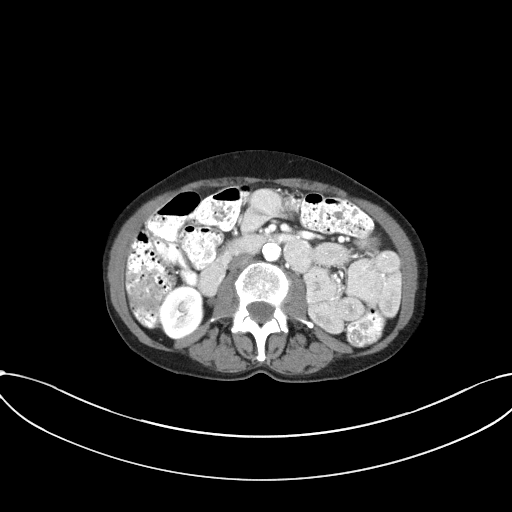
[im 54/86  bone]
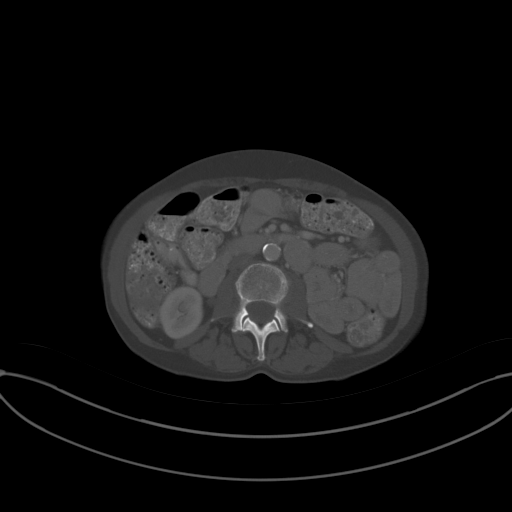
[im 63/86  soft-tissue]
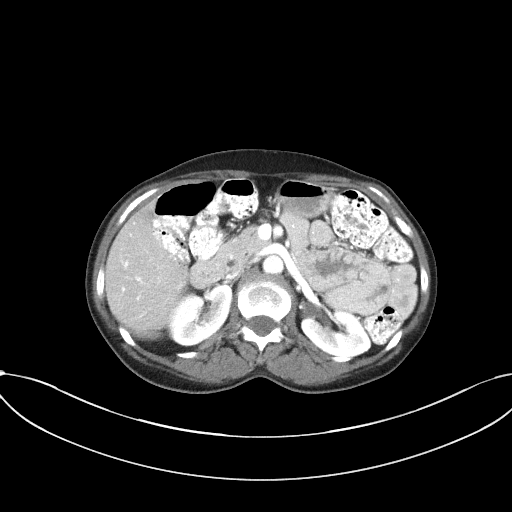
[im 68/86  soft-tissue]
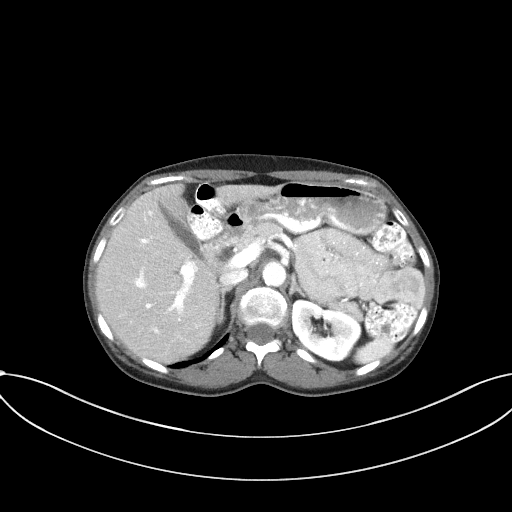
[im 72/86  soft-tissue]
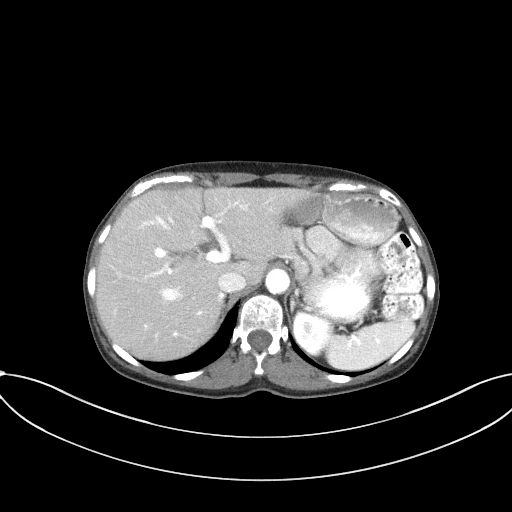
[im 81/86  soft-tissue]
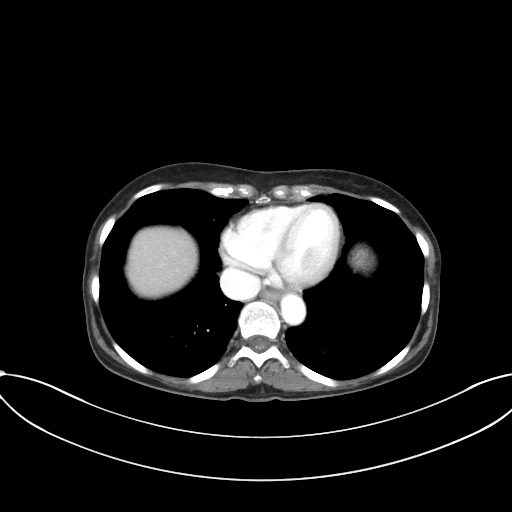

[Series 5: coronal st · coronal · 0.66mm/px · 3 of 72 slices shown]
[im 24/72  soft-tissue]
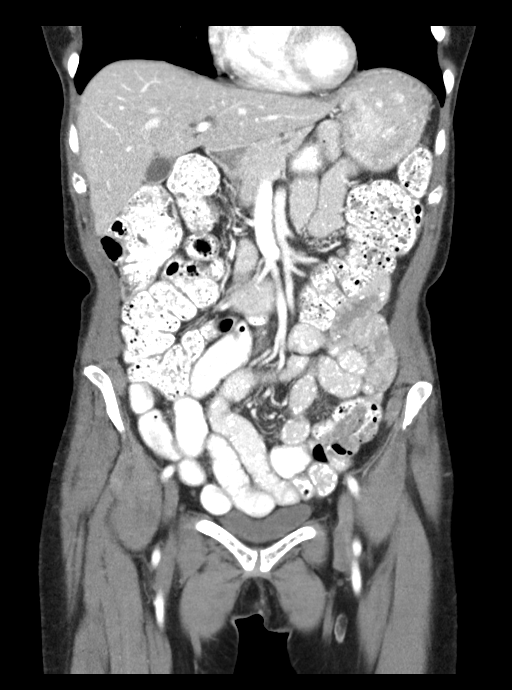
[im 32/72  soft-tissue]
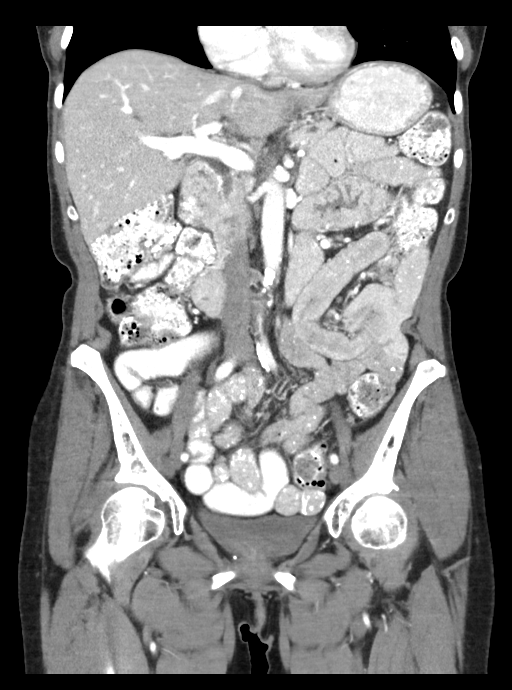
[im 40/72  soft-tissue]
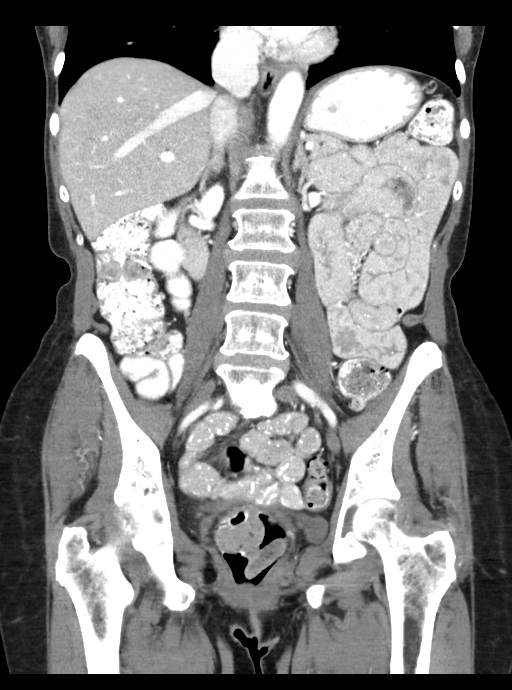

[16 of 46 positions shown; findings below may reference images not displayed]

FINDINGS: Lower chest: 3 mm right middle lobe pulmonary nodule is unchanged,
considered benign. Normal heart size without pericardial or pleural
effusion.

Hepatobiliary: Normal liver. Normal gallbladder, without biliary
ductal dilatation.

Pancreas: Again identified is mild pancreatic duct dilatation within
the body. This is followed to the level of the pancreatic neck where
an abrupt transition occurs, including on [DATE]. The duct is normal
in caliber within the head, no underlying mass is seen.

Spleen: Normal in size, without focal abnormality.

Adrenals/Urinary Tract: Normal adrenal glands. Normal kidneys,
without hydronephrosis. Normal urinary bladder.

Stomach/Bowel: Normal stomach, without wall thickening. Large amount
of stool throughout the colon, especially the rectum. Scattered
colonic diverticula. Normal terminal ileum. Normal small bowel.

Vascular/Lymphatic: Aortic atherosclerosis. No abdominopelvic
adenopathy.

Reproductive: Hysterectomy.  No adnexal mass.

Other: No significant free fluid. No evidence of omental or
peritoneal disease.

Musculoskeletal: Bilateral L5 pars defects with advanced
degenerative disc disease at this level. Grade 2 L5-S1
anterolisthesis. Similar.

Moderate disc bulge at L4-5.
IMPRESSION: 1.  Possible constipation.  No other explanation for pain.
2. Status post hysterectomy, without recurrent or metastatic
disease.
3. Similar appearance of the pancreas. Duct dilatation within the
body, suggesting ductal stricture within the neck.
4.  Aortic Atherosclerosis ([PV]-[PV]).

## 2020-04-19 MED ORDER — IOHEXOL 300 MG/ML  SOLN
100.0000 mL | Freq: Once | INTRAMUSCULAR | Status: AC | PRN
  Administered 2020-04-19: 100 mL via INTRAVENOUS

## 2020-04-21 ENCOUNTER — Encounter (HOSPITAL_BASED_OUTPATIENT_CLINIC_OR_DEPARTMENT_OTHER): Payer: Self-pay

## 2020-04-21 ENCOUNTER — Ambulatory Visit (HOSPITAL_BASED_OUTPATIENT_CLINIC_OR_DEPARTMENT_OTHER)
Admission: RE | Admit: 2020-04-21 | Discharge: 2020-04-21 | Disposition: A | Discharge status: Home / Self Care | Payer: Medicare Other | Source: Ambulatory Visit | Attending: Internal Medicine | Admitting: Internal Medicine

## 2020-04-21 ENCOUNTER — Other Ambulatory Visit: Payer: Self-pay

## 2020-04-21 DIAGNOSIS — M5416 Radiculopathy, lumbar region: Secondary | ICD-10-CM

## 2020-04-21 IMAGING — MR MR LUMBAR SPINE WO/W CM
4 of 7 series · 26 of 48 positions shown · IV contrast (gadavist)
Comparison: None available.

CLINICAL DATA: Initial evaluation for lower back pain with lateral
left leg pain since [DATE]. History of prior surgery in
[IM]. History of endometrial cancer.

EXAM:
MRI LUMBAR SPINE WITHOUT AND WITH CONTRAST
TECHNIQUE: Multiplanar and multiecho pulse sequences of the lumbar spine were
obtained without and with intravenous contrast.
CONTRAST:  5mL GADAVIST GADOBUTROL 1 MMOL/ML IV SOLN

[Series 4: T1 · sagittal · 4.0mm · 0.51mm/px · 4 of 13 slices shown (1 of 2)]
[im 1/13]
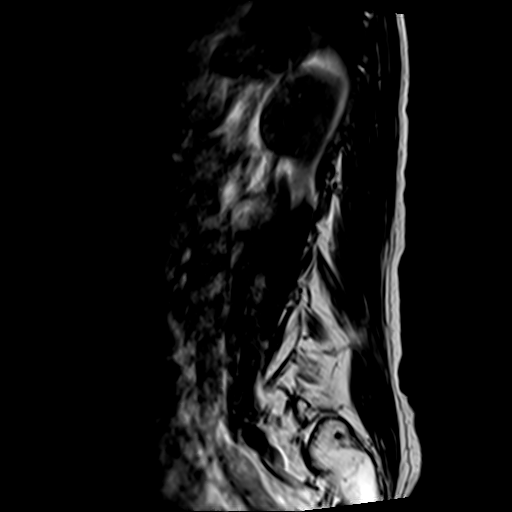
[im 5/13]
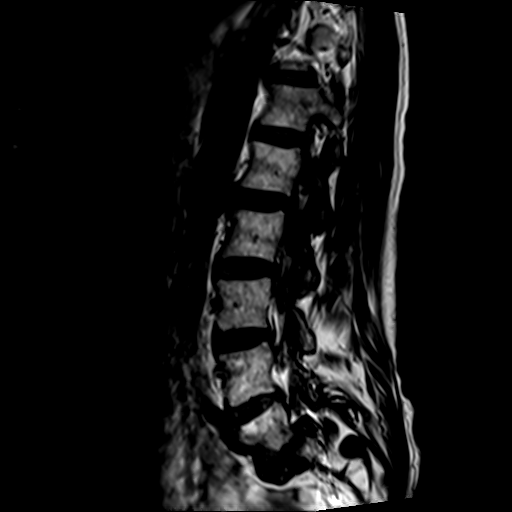
[im 9/13]
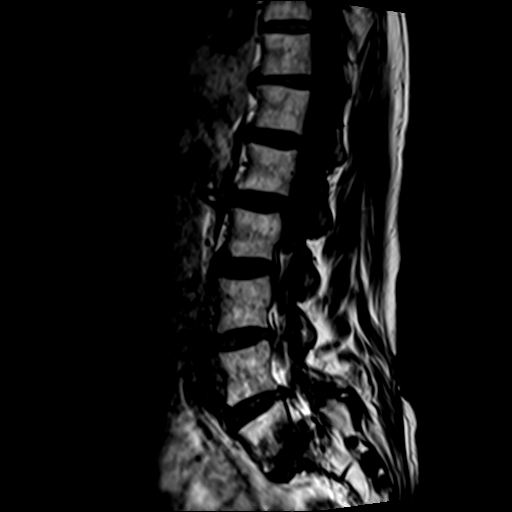
[im 13/13]
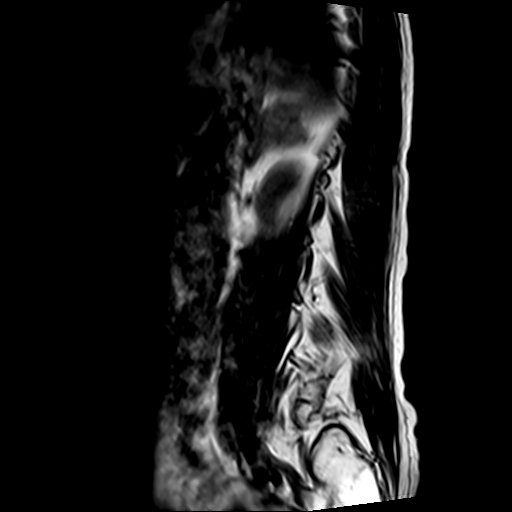

[Series 5: T2 · axial · 4.0mm · 0.39mm/px · z∈[-129,+71]mm · 11 of 37 slices shown (1 of 2)]
[im 1/37]
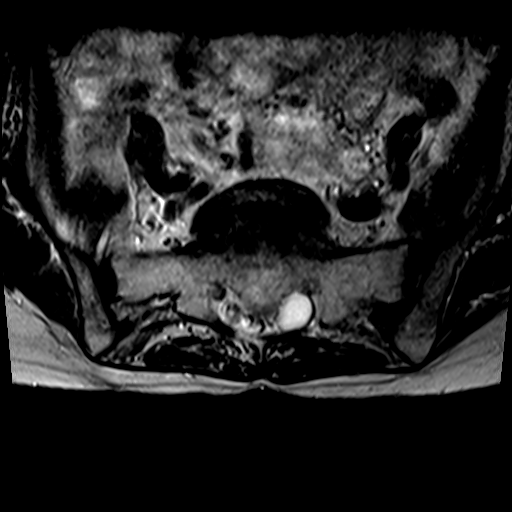
[im 4/37]
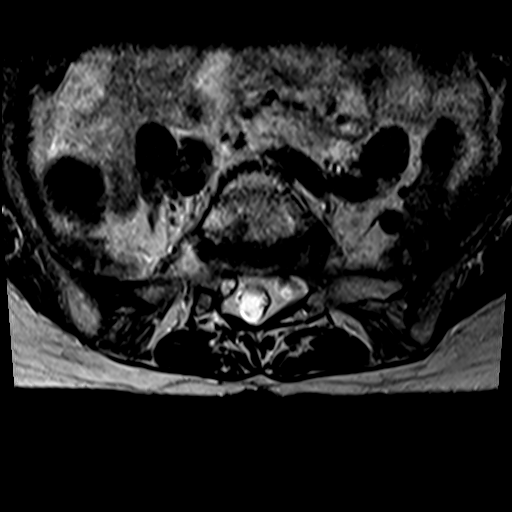
[im 8/37]
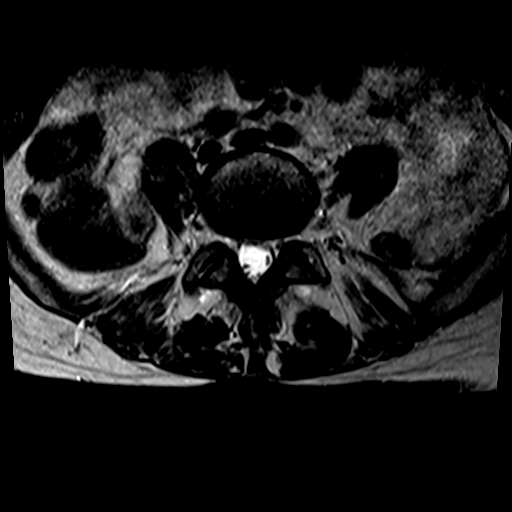
[im 11/37]
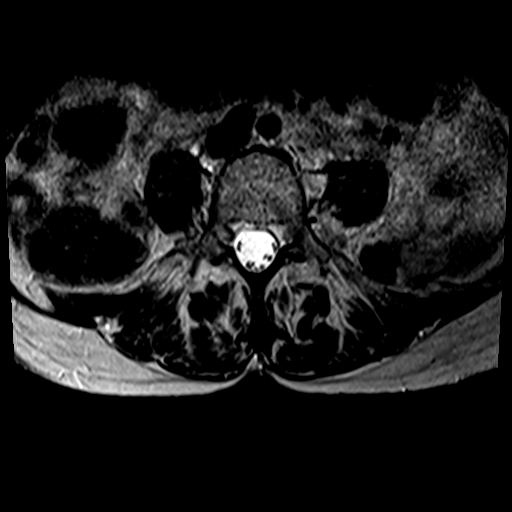
[im 15/37]
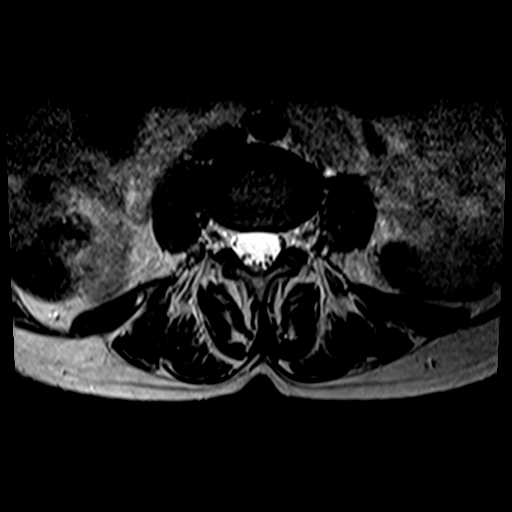
[im 19/37]
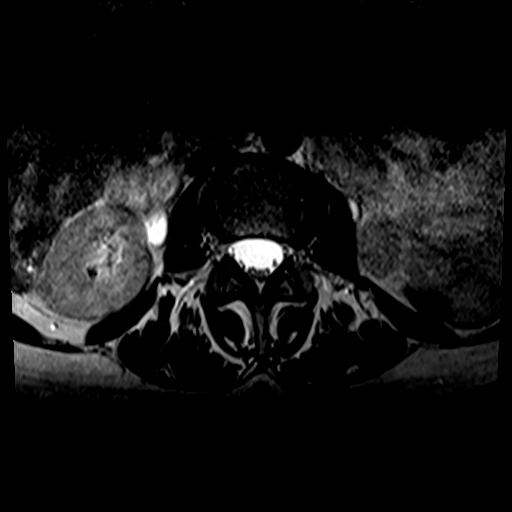
[im 22/37]
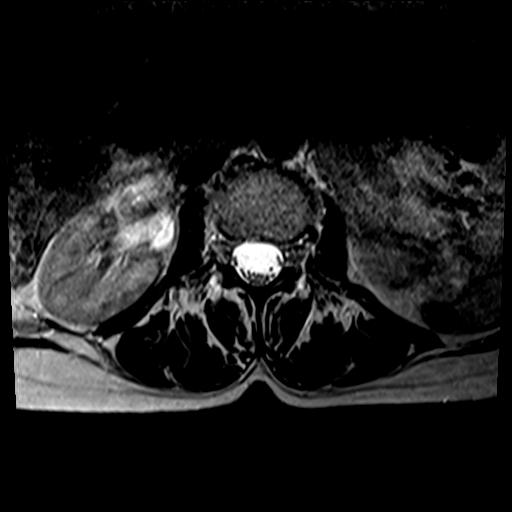
[im 26/37]
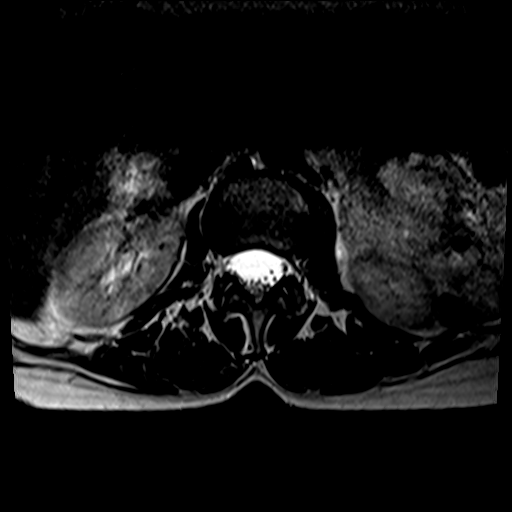
[im 29/37]
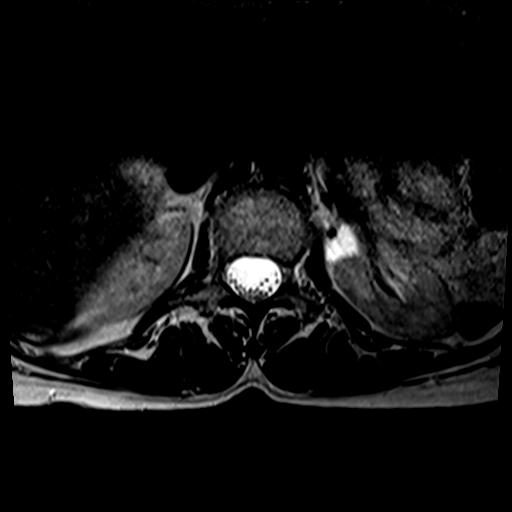
[im 33/37]
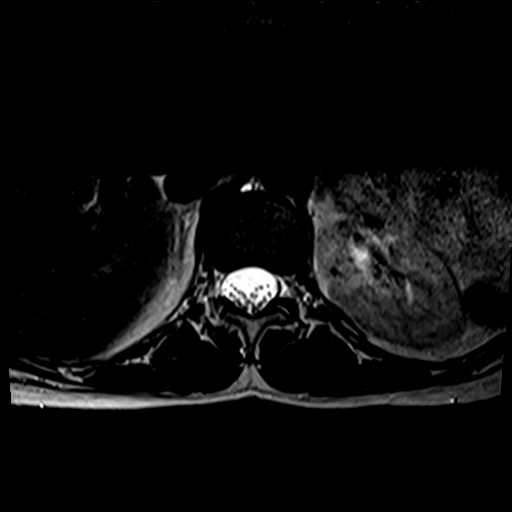
[im 37/37]
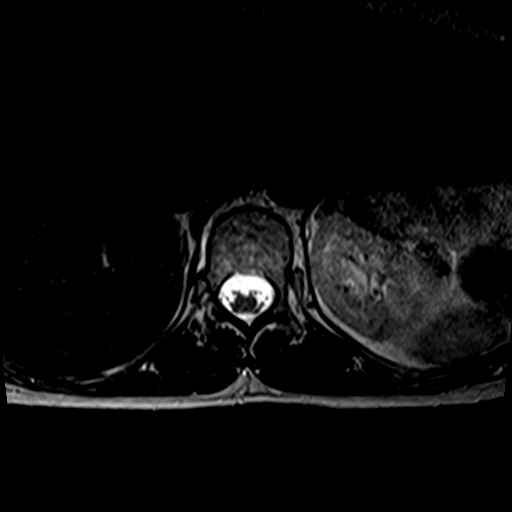

[Series 6: T1 · axial · 4.0mm · 0.78mm/px · z∈[-129,+51]mm · 7 of 37 slices shown (2 of 2)]
[im 1/37]
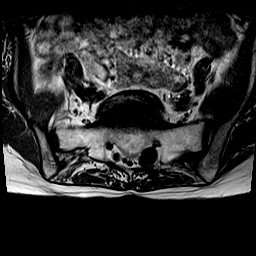
[im 4/37]
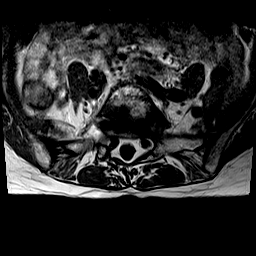
[im 8/37]
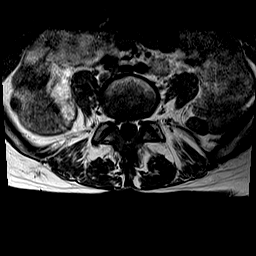
[im 11/37]
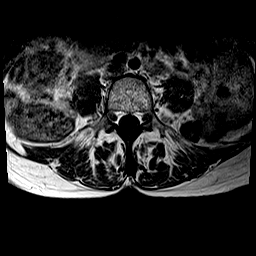
[im 15/37]
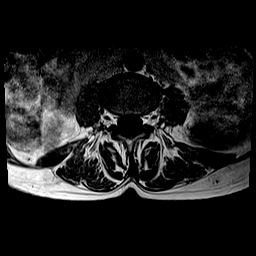
[im 19/37]
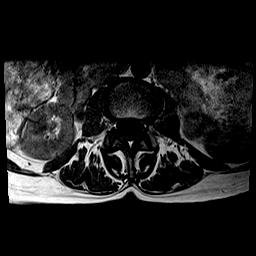
[im 33/37]
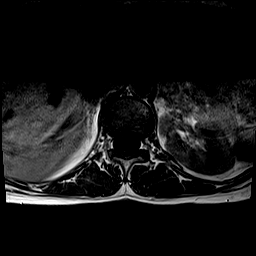

[Series 7: T2 · sagittal · 4.0mm · 1.02mm/px · 4 of 13 slices shown (2 of 2)]
[im 1/13]
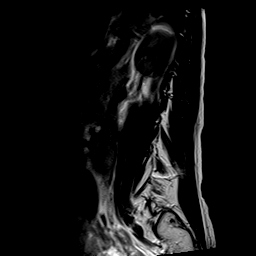
[im 5/13]
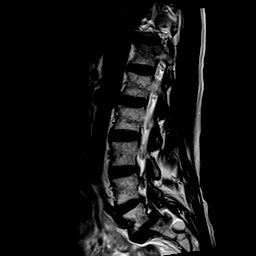
[im 9/13]
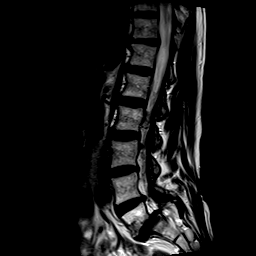
[im 13/13]
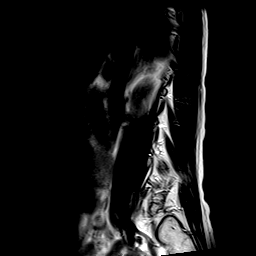

[26 of 48 positions shown; findings below may reference images not displayed]

FINDINGS: Segmentation: Standard. Lowest well-formed disc space labeled the
L5-S1 level.

Alignment: Chronic bilateral pars defects at L5 with associated 8 mm
spondylolisthesis of L5 on S1. Alignment otherwise normal with
preservation of the normal lumbar lordosis.

Vertebrae: Vertebral body height well maintained without acute or
chronic fracture. Bone marrow signal intensity mildly heterogeneous
but overall within normal limits. No discrete or worrisome osseous
lesions. Discogenic reactive endplate change with associated marrow
edema present about the L5-S1 interspace. No other abnormal marrow
edema or enhancement.

Conus medullaris and cauda equina: Conus extends to the T12-L1
level. Conus and cauda equina appear normal. Multiple benign Tarlov
cyst noted posterior to the sacrum.

Paraspinal and other soft tissues: Paraspinous soft tissues within
normal limits. Visualized visceral structures unremarkable.

Disc levels:

L1-2:  Unremarkable.

L2-3:  Unremarkable.

L3-4:  Unremarkable.

L4-5: Degenerative disc desiccation with mild diffuse disc bulge.
Associated central annular fissure. Mild bilateral facet
hypertrophy. Resultant mild narrowing of the lateral recesses
bilaterally without frank impingement. Central canal remains patent.
No significant foraminal encroachment.

L5-S1: Chronic bilateral pars defects with associated 8 mm
spondylolisthesis. Associated advanced degenerative intervertebral
disc space narrowing with disc desiccation and broad posterior
pseudo disc bulge. Associated prominent discogenic reactive endplate
change with associated marrow edema. No significant canal or lateral
recess stenosis. Moderate bilateral L5 foraminal narrowing related
to slippage and disc bulge, left slightly worse than right.
IMPRESSION: 1. Chronic bilateral pars defects at L5 with associated 8 mm
spondylolisthesis of L5 on S1, resulting in moderate bilateral L5
foraminal stenosis, left slightly worse than right. Either of the L5
nerve roots could be affected.
2. Disc bulge with facet hypertrophy at L4-5 with resultant mild
narrowing of the lateral recesses bilaterally.

## 2020-04-23 ENCOUNTER — Other Ambulatory Visit: Payer: Self-pay | Admitting: Family Medicine

## 2020-04-28 ENCOUNTER — Other Ambulatory Visit: Payer: Self-pay

## 2020-04-28 ENCOUNTER — Ambulatory Visit (HOSPITAL_BASED_OUTPATIENT_CLINIC_OR_DEPARTMENT_OTHER)
Admission: RE | Admit: 2020-04-28 | Discharge: 2020-04-28 | Disposition: A | Discharge status: Home / Self Care | Payer: Medicare Other | Source: Ambulatory Visit | Attending: Internal Medicine | Admitting: Internal Medicine

## 2020-04-28 DIAGNOSIS — M5416 Radiculopathy, lumbar region: Secondary | ICD-10-CM | POA: Diagnosis not present

## 2020-04-28 DIAGNOSIS — M545 Low back pain, unspecified: Secondary | ICD-10-CM | POA: Diagnosis not present

## 2020-04-28 MED ORDER — GADOBUTROL 1 MMOL/ML IV SOLN
5.0000 mL | Freq: Once | INTRAVENOUS | Status: AC | PRN
  Administered 2020-04-28: 5 mL via INTRAVENOUS

## 2020-05-21 ENCOUNTER — Ambulatory Visit: Payer: Medicare Other | Admitting: Podiatry

## 2020-05-21 DIAGNOSIS — R030 Elevated blood-pressure reading, without diagnosis of hypertension: Secondary | ICD-10-CM | POA: Insufficient documentation

## 2020-05-21 DIAGNOSIS — M4807 Spinal stenosis, lumbosacral region: Secondary | ICD-10-CM | POA: Diagnosis not present

## 2020-06-08 ENCOUNTER — Telehealth: Payer: Self-pay

## 2020-06-08 NOTE — Telephone Encounter (Signed)
-----   Message from Timothy Lasso, RN sent at 06/09/2019 10:18 AM EST ----- recommend repeat MRI in 1 year

## 2020-06-08 NOTE — Telephone Encounter (Signed)
Left message on machine to call back  

## 2020-06-11 NOTE — Telephone Encounter (Signed)
Tried to reach pt voice mail full.  Will mail letter

## 2020-06-22 NOTE — Telephone Encounter (Signed)
See My chart.  I have sent a message to ask if the pt would like to proceed with MRI that is recommended for follow up.

## 2020-06-22 NOTE — Telephone Encounter (Signed)
Pt called in response to a letter that she received to schedule repeat MRI. Pt states that unknown phone numbers goes right to voice mail. Pt agreed on communicating via Mychart since it is not easy to reach her over the phone.

## 2020-07-20 ENCOUNTER — Ambulatory Visit: Payer: Medicare Other | Admitting: Podiatry

## 2020-09-03 DIAGNOSIS — Z1231 Encounter for screening mammogram for malignant neoplasm of breast: Secondary | ICD-10-CM | POA: Diagnosis not present

## 2020-09-27 ENCOUNTER — Encounter: Payer: Self-pay | Admitting: Gynecologic Oncology

## 2020-09-27 NOTE — Progress Notes (Signed)
Follow-up Note: Gyn-Onc  Consult was requested by Dr. Benjie Karvonen for the evaluation of Kimberly [name] 70 y.o. female  CC:  Chief Complaint  Patient presents with  . Endometrial cancer determined by uterine biopsy [facility])   Assessment/Plan:  Ms. Kimberly [name]  is a 70 y.o.  year old with stage IB grade 3 endometrioid endometrial cancer (MSI low/stable). Surgery December, 2018. S/p adjuvant external beam and brachytherapy completed March, 2019. No evidence of disease recurrence. I recommend continuing 6 monthly evaluations until March, 2024 Counseled regarding symptoms of recurrence.  HPI: Kimberly [name] is a 70 year old P2 who was seen in consultation at the request of Dr Benjie Karvonen for grade 2 endometrial cancer.  The patient has a history of postmenopausal spotting since April, 2018. She was seen by a PA in her PCP's office in July, 2018 and a pap test was performed which per patient was negative and therefore no intervention followed. The patient continued to have spotting and informed her PCP, Dr Brigitte Pulse, of this in October during her annual physical. He then promptly referred her to Dr [facility] office 02/27/17 and a TVUS was performed which showed a uterus measuring normal dimensions and a 37m endometrial stripe.  On 02/28/17 an office pipelle biopsy was performed and revealed a FIGO grade 2 endometrioid endometrial cancer.  Her sister has a history of breast and ovarian cancer, and while she was not tested, [name] underwent genetic testing for BRCA and MLH/MSH abnormalities which were unremarkable.    On 04/13/17 she underwent robotic assisted total hysterectomy BSO, SLN [facility] Final pathology revealed a deeply invasive grade 3 endometrioid tumor with multifocal LVSI present and ITCs present in a left external iliac SLN. Assiged stage IB grade 3. MSI low (stable). Due to these high risk features, she was determined to be at high risk for recurrence and adjuvant external beam  radiation with vaginal brachytherapy were recommended.   She went on to receive external beam and vaginal brachytherapy completed in February, 2019. She tolerated therapy well.  Interval Hx:  The patient received a CT scan of the abdomen and pelvis on December 29, 2018.  This showed no evidence of recurrent or metastatic endometrial cancer, however it did reveal mild pancreatic ductal dilation in the body and tail with a transition point in the pancreatic body.  She then underwent a follow-up MR of the pancreas which confirmed the ectatic pancreatic duct and endoscopic ultrasound was recommended.  In early October 2020 she underwent an endoscopic ultrasound scan which revealed no mass or cyst in the pancreatic tail just the ectatic duct.  No biopsies were performed.  Recommendation was made for follow-up MRI in 3 months. MRI in January, 2021 showed there is persistent ductal dilatation throughout the distal body and tail of the pancreas which appears stable compared to prior examinations. No discrete obstructing pancreatic mass is confidently identified on today's examination. Given the stability of the findings and absence of definite mass, a pancreatic stricture is suspected.  She is doing well without any complaints. She is not sexually active.    Current Meds:  Outpatient Encounter Medications as of 09/28/2020  Medication Sig  . Ascorbic Acid (VITAMIN C) 1000 MG tablet Take 1,000 mg by mouth daily.  .Marland Kitchenb complex vitamins tablet Take 1 tablet by mouth daily.  . Biotin 1000 MCG tablet Take 1,000 mcg by mouth daily.  . Ca Phosphate-Cholecalciferol (CALTRATE GUMMY BITES) 250-400 MG-UNIT CHEW Chew 3 tablets by mouth  at bedtime.  . Calcium Carbonate-Vitamin D (CALTRATE 600+D PO) Take 1 tablet by mouth daily.  . Cholecalciferol (VITAMIN D) 50 MCG (2000 UT) tablet Take 2,000 Units by mouth daily.  . diphenhydrAMINE (BENADRYL) 25 MG tablet Take 25 mg by mouth daily as needed for allergies.  . ferrous  sulfate 325 (65 FE) MG tablet Take 325 mg by mouth daily with breakfast.  . latanoprost (XALATAN) 0.005 % ophthalmic solution Place 1 drop into both eyes at bedtime.   . metFORMIN (GLUCOPHAGE) 1000 MG tablet Take 500 mg by mouth 2 (two) times daily with a meal.   . Multiple Vitamin (MULTIVITAMIN WITH MINERALS) TABS tablet Take 1 tablet by mouth daily.  . rosuvastatin (CRESTOR) 10 MG tablet Take 10 mg by mouth at bedtime.   . tretinoin (RETIN-A) 0.1 % cream Apply 1 application topically 3 (three) times a week. At night  . zolpidem (AMBIEN CR) 12.5 MG CR tablet Take 12.5 mg by mouth at bedtime.  . [DISCONTINUED] 0.9 %  sodium chloride infusion    No facility-administered encounter medications on file as of 09/28/2020.    Allergy:  Allergies  Allergen Reactions  . Elemental Sulfur Hives    Social Hx:   Social History   Socioeconomic History  . Marital status: Divorced    Spouse name: Not on file  . Number of children: Not on file  . Years of education: Not on file  . Highest education level: Not on file  Occupational History  . Not on file  Tobacco Use  . Smoking status: Never Smoker  . Smokeless tobacco: Never Used  Vaping Use  . Vaping Use: Never used  Substance and Sexual Activity  . Alcohol use: Yes    Alcohol/week: 7.0 standard drinks    Types: 7 Glasses of wine per week    Comment: 1 glass red wine each night with dinner  . Drug use: No  . Sexual activity: Not on file  Other Topics Concern  . Not on file  Social History Narrative  . Not on file   Social Determinants of Health   Financial Resource Strain: Not on file  Food Insecurity: Not on file  Transportation Needs: Not on file  Physical Activity: Not on file  Stress: Not on file  Social Connections: Not on file  Intimate Partner Violence: Not on file    Past Surgical Hx:  Past Surgical History:  Procedure Laterality Date  . APPENDECTOMY    . BACK SURGERY     LOWER  . CARDIAC SURGERY    .  COLONOSCOPY  2020   X 2  . ESOPHAGOGASTRODUODENOSCOPY (EGD) WITH PROPOFOL N/A 02/17/2019   Procedure: ESOPHAGOGASTRODUODENOSCOPY (EGD) WITH PROPOFOL;  Surgeon: Milus Banister, MD;  Location: [facility];  Service: Endoscopy;  Laterality: N/A;  . EUS N/A 02/17/2019   Procedure: UPPER ENDOSCOPIC ULTRASOUND (EUS) RADIAL;  Surgeon: Milus Banister, MD;  Location: [facility];  Service: Endoscopy;  Laterality: N/A;  . EYE SURGERY  07/2016   LEFT EYE CATARACT  . OVARY REMOVE    . SVT ABLATION  2013    Past Medical Hx:  Past Medical History:  Diagnosis Date  . Cataract    RIGHT EYE  . Complication of anesthesia AGE LATE 20'S   SLOW TO AWAKEN AFTER DERMOID CYST REMOVED FROM [place]  . Diabetes mellitus without complication ([place])    TYPE 2  . Endometrial cancer ([place]) 2019   SX DONE AND 5 DUCTS REMOVED  .  Pancreatic lesion     Past Gynecological History:  SVD x 2 No LMP recorded. Patient has had a hysterectomy.  Family Hx:  Family History  Problem Relation Age of Onset  . Diabetes Mother   . Heart disease Father   . Breast cancer Sister   . Brain cancer Brother   . Colon cancer Neg Hx     Review of Systems:  Constitutional  Feels well,    ENT Normal appearing ears and nares bilaterally Skin/Breast  No rash, sores, jaundice, itching, dryness Cardiovascular  No chest pain, shortness of breath, or edema  Pulmonary  No cough or wheeze.  Gastro Intestinal  No nausea, vomitting, or diarrhoea. No bright red blood per rectum, no abdominal pain, change in bowel movement, or constipation.  Genito Urinary  No frequency, urgency, dysuria, no postmenopausal bleeding Musculo Skeletal  No myalgia, arthralgia, joint swelling or pain  Neurologic  No weakness, numbness, change in gait,  Psychology  No depression, anxiety, insomnia.   Vitals:  There were no vitals taken for this visit.  Physical Exam: WD in NAD Neck  Supple NROM, without any enlargements.   Lymph Node Survey No cervical supraclavicular or inguinal adenopathy Cardiovascular  Pulse normal rate, regularity and rhythm. S1 and S2 normal.  Lungs  Clear to auscultation bilateraly, without wheezes/crackles/rhonchi. Good air movement.  Skin  No rash/lesions/breakdown  Psychiatry  Alert and oriented to person, place, and time  Abdomen  Normoactive bowel sounds, abdomen soft, non-tender and thin without evidence of hernia. No masses.  Back No CVA tenderness Genito Urinary  Normal external genitalia. + rectocele, shortened atrophic vagina with ring at mid point consistent with radiation changes.  Rectal  deferred Extremities  No bilateral cyanosis, clubbing or edema.   Thereasa Solo, MD  09/28/2020, 2:34 PM

## 2020-09-28 ENCOUNTER — Other Ambulatory Visit: Payer: Self-pay

## 2020-09-28 ENCOUNTER — Inpatient Hospital Stay: Payer: Medicare Other | Attending: Gynecologic Oncology | Admitting: Gynecologic Oncology

## 2020-09-28 VITALS — BP 125/72 | HR 78 | Temp 97.6°F | Resp 16 | Ht 62.0 in | Wt 103.2 lb

## 2020-09-28 DIAGNOSIS — Z7984 Long term (current) use of oral hypoglycemic drugs: Secondary | ICD-10-CM | POA: Diagnosis not present

## 2020-09-28 DIAGNOSIS — Z79899 Other long term (current) drug therapy: Secondary | ICD-10-CM | POA: Insufficient documentation

## 2020-09-28 DIAGNOSIS — Z8041 Family history of malignant neoplasm of ovary: Secondary | ICD-10-CM | POA: Diagnosis not present

## 2020-09-28 DIAGNOSIS — N816 Rectocele: Secondary | ICD-10-CM | POA: Diagnosis not present

## 2020-09-28 DIAGNOSIS — E119 Type 2 diabetes mellitus without complications: Secondary | ICD-10-CM | POA: Diagnosis not present

## 2020-09-28 DIAGNOSIS — Z90722 Acquired absence of ovaries, bilateral: Secondary | ICD-10-CM | POA: Diagnosis not present

## 2020-09-28 DIAGNOSIS — Z90710 Acquired absence of both cervix and uterus: Secondary | ICD-10-CM | POA: Insufficient documentation

## 2020-09-28 DIAGNOSIS — Z923 Personal history of irradiation: Secondary | ICD-10-CM | POA: Insufficient documentation

## 2020-09-28 DIAGNOSIS — Z8542 Personal history of malignant neoplasm of other parts of uterus: Secondary | ICD-10-CM | POA: Insufficient documentation

## 2020-09-28 DIAGNOSIS — C541 Malignant neoplasm of endometrium: Secondary | ICD-10-CM

## 2020-09-28 NOTE — Patient Instructions (Signed)
Please notify Dr Denman George at phone number [phone] if you notice vaginal bleeding, new pelvic or abdominal pains, bloating, feeling full easy, or a change in bladder or bowel function.   Please have Dr Clabe Seal office contact Dr Serita Grit office (at [phone]) in November after your appointment with him to request an appointment with Dr Denman George for May, 2023.

## 2020-10-24 ENCOUNTER — Other Ambulatory Visit: Payer: Self-pay

## 2020-10-24 ENCOUNTER — Ambulatory Visit (INDEPENDENT_AMBULATORY_CARE_PROVIDER_SITE_OTHER): Payer: Medicare Other | Admitting: Podiatry

## 2020-10-24 DIAGNOSIS — L84 Corns and callosities: Secondary | ICD-10-CM

## 2020-10-24 DIAGNOSIS — E119 Type 2 diabetes mellitus without complications: Secondary | ICD-10-CM | POA: Diagnosis not present

## 2020-10-24 DIAGNOSIS — M79675 Pain in left toe(s): Secondary | ICD-10-CM

## 2020-10-24 DIAGNOSIS — B351 Tinea unguium: Secondary | ICD-10-CM | POA: Diagnosis not present

## 2020-10-24 DIAGNOSIS — M79674 Pain in right toe(s): Secondary | ICD-10-CM | POA: Diagnosis not present

## 2020-10-31 ENCOUNTER — Encounter: Payer: Self-pay | Admitting: Podiatry

## 2020-10-31 NOTE — Progress Notes (Signed)
  Subjective:  Patient ID: Kimberly [name], female    DOB: Apr 12, 1951,  MRN: [id number]  69 y.o. female presents preventative diabetic foot care and callus(es) right hallux and painful thick toenails that are difficult to trim. Painful toenails interfere with ambulation. Aggravating factors include wearing enclosed shoe gear. Pain is relieved with periodic professional debridement. Painful calluses are aggravated when weightbearing with and without shoegear. Pain is relieved with periodic professional debridement.  Patient does not monitor blood glucose daily. Last A1c was 6.1%.   She notes no new pedal problems on today's visit.  Allergies  Allergen Reactions   Elemental Sulfur Hives    Review of Systems: Negative except as noted in the HPI.   Objective:   Constitutional Pt is a pleasant 70 y.o. Caucasian female WD, WN in NAD. AAO x 3.   Vascular Capillary fill time to digits <3 seconds b/l lower extremities. Palpable pedal pulses b/l LE. Pedal hair absent. Lower extremity skin temperature gradient within normal limits. No pain with calf compression b/l. No edema noted b/l lower extremities.  Neurologic Protective sensation intact 5/5 intact bilaterally with 10g monofilament b/l. Vibratory sensation intact b/l. Proprioception intact bilaterally.  Dermatologic Toenails 1-5 left, R hallux, R 3rd toe, R 4th toe, and R 5th toe elongated, discolored, dystrophic, thickened, and crumbly with subungual debris and tenderness to dorsal palpation. Hyperkeratotic lesion(s) R hallux.  No erythema, no edema, no drainage, no fluctuance.  Orthopedic: Normal muscle strength 5/5 to all lower extremity muscle groups bilaterally. No pain crepitus or joint limitation noted with ROM b/l. No gross bony deformities bilaterally.   Radiographs: None Assessment:   1. Pain due to onychomycosis of toenails of both feet   2. Callus   3. Controlled type 2 diabetes mellitus without complication, without long-term current  use of insulin ([place])     Plan:  Patient was evaluated and treated and all questions answered.  Onychomycosis with pain -Nails palliatively debridement as below. -Educated on self-care  Procedure: Nail Debridement Rationale: Pain Type of Debridement: manual, sharp debridement. Instrumentation: Nail nipper, rotary burr. Number of Nails: 10  -Examined patient. -Patient to continue soft, supportive shoe gear daily. -Toenails 1-5 left, R hallux, R 3rd toe, R 4th toe, and R 5th toe debrided in length and girth without iatrogenic bleeding with sterile nail nipper and dremel.  -Callus(es) R hallux pared utilizing sterile scalpel blade without complication or incident. Total number debrided =1. -Patient to report any pedal injuries to medical professional immediately. -Patient/POA to call should there be question/concern in the interim.  Return in about 3 months (around 01/24/2021).  Marzetta Board, DPM

## 2021-02-01 ENCOUNTER — Ambulatory Visit: Payer: Medicare Other | Admitting: Podiatry

## 2021-03-17 DIAGNOSIS — Z23 Encounter for immunization: Secondary | ICD-10-CM | POA: Diagnosis not present

## 2021-03-29 ENCOUNTER — Encounter: Payer: Self-pay | Admitting: Radiology

## 2021-04-06 NOTE — Progress Notes (Incomplete)
Radiation Oncology         [phone] ________________________________  Name: Kimberly [name] MRN: [id number]  Date: 04/08/2021  DOB: 01/27/1951  Follow-Up Visit Note  CC: Ginger Organ., MD  Ginger Organ., MD  No diagnosis found.  Diagnosis:  Stage IB, grade 3 endometrioid adenocarcinoma of the endometrium, multifocal lymphovascular space invasion, isolated tumor cells within left external iliac lymph node chain   Interval Since Last Radiation: 3 years, 8 months, and 3 days   1. 06/11/17 - 07/15/17 (external beam): Pelvis / 45 Gy in 25 fractions   2. 07/21/17 - 08/03/17 (brachytherapy): Vaginal cuff / 18 Gy in 3 fractions  Narrative:  The patient returns today for routine follow-up, she was last seen here for follow-up on 04/09/2020. Since her last visit, the patient followed up with Dr. Denman George on 09/28/20. During which time, Dr. Denman George noted no discrete obstructing pancreatic mass confidently identified on examination. Given the stability of the findings and absence of definite mass, a pancreatic stricture was noted as suspected based on  MRI in January 2021 (which showed a stable persistent ductal dilation throughout the distal body and tail of the pancreas). Overall, the patient was noted to be doing well during this visit and without any complaints. Of note: physical exam also revealed the vagina to appear shortened and atrophic, with a ring at mid point consistent with radiation changes.          Pertinent imaging since the patient was last seen includes:  --CT of the abdomen and pelvis on 04/19/2020 (prompted by reported hip pain since October 2021) which demonstrated the similar appearance of the pancreas and duct dilatation within the body, suggestive of ductal stricture within the neck. No other explanation for the patient's hip pain was appreciated, and no evidence of metastatic disease.    --MRI of the lumbar spine on 04/28/2020 (also prompted by lower back pain and  lateral left leg pain since October 2021) demonstrated chronic bilateral pars defects at L5 with associated 8 mm spondylolisthesis of L5 on S1, resulting in moderate bilateral L5 foraminal stenosis, noted as left slightly worse than right. Either of the L5 nerve roots were noted as  possibly affected. Disc bulging with facet hypertrophy at L4-5, with resultant mild narrowing of the lateral recesses bilaterally were also appreciated.                 Allergies:  is allergic to elemental sulfur.  Meds: Current Outpatient Medications  Medication Sig Dispense Refill   Ascorbic Acid (VITAMIN C) 1000 MG tablet Take 1,000 mg by mouth daily.     b complex vitamins tablet Take 1 tablet by mouth daily.     Biotin 1000 MCG tablet Take 1,000 mcg by mouth daily.     Ca Phosphate-Cholecalciferol (CALTRATE GUMMY BITES) 250-400 MG-UNIT CHEW Chew 3 tablets by mouth at bedtime.     Calcium Carbonate-Vitamin D (CALTRATE 600+D PO) Take 1 tablet by mouth daily.     Cholecalciferol (VITAMIN D) 50 MCG (2000 UT) tablet Take 2,000 Units by mouth daily.     diphenhydrAMINE (BENADRYL) 25 MG tablet Take 25 mg by mouth daily as needed for allergies.     ferrous sulfate 325 (65 FE) MG tablet Take 325 mg by mouth daily with breakfast.     latanoprost (XALATAN) 0.005 % ophthalmic solution Place 1 drop into both eyes at bedtime.      metFORMIN (GLUCOPHAGE) 1000 MG tablet Take 500 mg by mouth  2 (two) times daily with a meal.      Multiple Vitamin (MULTIVITAMIN WITH MINERALS) TABS tablet Take 1 tablet by mouth daily.     rosuvastatin (CRESTOR) 10 MG tablet Take 10 mg by mouth at bedtime.      tretinoin (RETIN-A) 0.1 % cream Apply 1 application topically 3 (three) times a week. At night     zolpidem (AMBIEN CR) 12.5 MG CR tablet Take 12.5 mg by mouth at bedtime.     No current facility-administered medications for this encounter.    Physical Findings: The patient is in no acute distress. Patient is alert and oriented.  vitals  were not taken for this visit. .  No significant changes. Lungs are clear to auscultation bilaterally. Heart has regular rate and rhythm. No palpable cervical, supraclavicular, or axillary adenopathy. Abdomen soft, non-tender, normal bowel sounds.  On pelvic examination the external genitalia were unremarkable. A speculum exam was performed. There are no mucosal lesions noted in the vaginal vault. A Pap smear was obtained of the proximal vagina. On bimanual and rectovaginal examination there were no pelvic masses appreciated. ***    Lab Findings: Lab Results  Component Value Date   WBC 4.7 12/29/2018   HGB 12.4 12/29/2018   HCT 36.6 12/29/2018   MCV 94.6 12/29/2018   PLT 269 12/29/2018    Radiographic Findings: No results found.  Impression:  Stage IB, grade 3 endometrioid adenocarcinoma of the endometrium, multifocal lymphovascular space invasion, isolated tumor cells within left external iliac lymph node chain   The patient is recovering from the effects of radiation.  ***  Plan:  ***   *** minutes of total time was spent for this patient encounter, including preparation, face-to-face counseling with the patient and coordination of care, physical exam, and documentation of the encounter. ____________________________________  Blair Promise, PhD, MD   This document serves as a record of services personally performed by Gery Pray, MD. It was created on his behalf by Roney Mans, a trained medical scribe. The creation of this record is based on the scribe's personal observations and the provider's statements to them. This document has been checked and approved by the attending provider.

## 2021-04-08 ENCOUNTER — Ambulatory Visit
Admission: RE | Admit: 2021-04-08 | Discharge: 2021-04-08 | Disposition: A | Discharge status: Home / Self Care | Payer: Medicare Other | Source: Ambulatory Visit | Attending: Radiation Oncology | Admitting: Radiation Oncology

## 2021-05-24 DIAGNOSIS — E119 Type 2 diabetes mellitus without complications: Secondary | ICD-10-CM | POA: Diagnosis not present

## 2021-05-24 DIAGNOSIS — R7989 Other specified abnormal findings of blood chemistry: Secondary | ICD-10-CM | POA: Diagnosis not present

## 2021-05-24 DIAGNOSIS — E785 Hyperlipidemia, unspecified: Secondary | ICD-10-CM | POA: Diagnosis not present

## 2021-05-24 DIAGNOSIS — M8580 Other specified disorders of bone density and structure, unspecified site: Secondary | ICD-10-CM | POA: Diagnosis not present

## 2021-05-30 DIAGNOSIS — M8589 Other specified disorders of bone density and structure, multiple sites: Secondary | ICD-10-CM | POA: Diagnosis not present

## 2021-09-09 DIAGNOSIS — Z1231 Encounter for screening mammogram for malignant neoplasm of breast: Secondary | ICD-10-CM | POA: Diagnosis not present

## 2021-10-21 ENCOUNTER — Telehealth: Payer: Self-pay | Admitting: *Deleted

## 2021-10-21 NOTE — Telephone Encounter (Signed)
Patient called and canceled her appt for 6/14. Patient will call back to reschedule

## 2021-10-23 ENCOUNTER — Ambulatory Visit: Payer: Medicare Other | Admitting: Obstetrics & Gynecology

## 2022-02-03 DIAGNOSIS — Z23 Encounter for immunization: Secondary | ICD-10-CM | POA: Diagnosis not present

## 2022-07-07 DIAGNOSIS — Z85828 Personal history of other malignant neoplasm of skin: Secondary | ICD-10-CM | POA: Diagnosis not present

## 2022-07-07 DIAGNOSIS — L821 Other seborrheic keratosis: Secondary | ICD-10-CM | POA: Diagnosis not present

## 2022-07-07 DIAGNOSIS — D2261 Melanocytic nevi of right upper limb, including shoulder: Secondary | ICD-10-CM | POA: Diagnosis not present

## 2022-07-07 DIAGNOSIS — D1801 Hemangioma of skin and subcutaneous tissue: Secondary | ICD-10-CM | POA: Diagnosis not present

## 2022-07-07 DIAGNOSIS — D225 Melanocytic nevi of trunk: Secondary | ICD-10-CM | POA: Diagnosis not present

## 2022-07-07 DIAGNOSIS — L720 Epidermal cyst: Secondary | ICD-10-CM | POA: Diagnosis not present

## 2022-07-08 DIAGNOSIS — M8580 Other specified disorders of bone density and structure, unspecified site: Secondary | ICD-10-CM | POA: Diagnosis not present

## 2022-07-08 DIAGNOSIS — E785 Hyperlipidemia, unspecified: Secondary | ICD-10-CM | POA: Diagnosis not present

## 2022-07-08 DIAGNOSIS — E119 Type 2 diabetes mellitus without complications: Secondary | ICD-10-CM | POA: Diagnosis not present

## 2022-07-15 DIAGNOSIS — R82998 Other abnormal findings in urine: Secondary | ICD-10-CM | POA: Diagnosis not present

## 2022-07-15 DIAGNOSIS — E119 Type 2 diabetes mellitus without complications: Secondary | ICD-10-CM | POA: Diagnosis not present

## 2022-07-21 DIAGNOSIS — H52203 Unspecified astigmatism, bilateral: Secondary | ICD-10-CM | POA: Diagnosis not present

## 2022-07-21 DIAGNOSIS — H26492 Other secondary cataract, left eye: Secondary | ICD-10-CM | POA: Diagnosis not present

## 2022-07-21 DIAGNOSIS — H2511 Age-related nuclear cataract, right eye: Secondary | ICD-10-CM | POA: Diagnosis not present

## 2022-07-21 DIAGNOSIS — H40023 Open angle with borderline findings, high risk, bilateral: Secondary | ICD-10-CM | POA: Diagnosis not present

## 2022-07-21 DIAGNOSIS — E119 Type 2 diabetes mellitus without complications: Secondary | ICD-10-CM | POA: Diagnosis not present

## 2022-08-13 DIAGNOSIS — H26492 Other secondary cataract, left eye: Secondary | ICD-10-CM | POA: Diagnosis not present

## 2022-08-26 DIAGNOSIS — H40013 Open angle with borderline findings, low risk, bilateral: Secondary | ICD-10-CM | POA: Diagnosis not present

## 2022-08-26 DIAGNOSIS — H40053 Ocular hypertension, bilateral: Secondary | ICD-10-CM | POA: Diagnosis not present

## 2022-08-26 DIAGNOSIS — H2511 Age-related nuclear cataract, right eye: Secondary | ICD-10-CM | POA: Diagnosis not present

## 2022-09-03 DIAGNOSIS — E119 Type 2 diabetes mellitus without complications: Secondary | ICD-10-CM | POA: Diagnosis not present

## 2022-09-03 DIAGNOSIS — R5383 Other fatigue: Secondary | ICD-10-CM | POA: Diagnosis not present

## 2022-09-03 DIAGNOSIS — J309 Allergic rhinitis, unspecified: Secondary | ICD-10-CM | POA: Diagnosis not present

## 2022-09-03 DIAGNOSIS — Z1152 Encounter for screening for COVID-19: Secondary | ICD-10-CM | POA: Diagnosis not present

## 2022-09-03 DIAGNOSIS — J3489 Other specified disorders of nose and nasal sinuses: Secondary | ICD-10-CM | POA: Diagnosis not present

## 2022-09-03 DIAGNOSIS — R058 Other specified cough: Secondary | ICD-10-CM | POA: Diagnosis not present

## 2022-10-16 DIAGNOSIS — J3489 Other specified disorders of nose and nasal sinuses: Secondary | ICD-10-CM | POA: Diagnosis not present

## 2022-10-16 DIAGNOSIS — J309 Allergic rhinitis, unspecified: Secondary | ICD-10-CM | POA: Diagnosis not present

## 2022-10-16 DIAGNOSIS — R058 Other specified cough: Secondary | ICD-10-CM | POA: Diagnosis not present

## 2023-05-18 DIAGNOSIS — R053 Chronic cough: Secondary | ICD-10-CM | POA: Diagnosis not present

## 2023-05-18 DIAGNOSIS — J309 Allergic rhinitis, unspecified: Secondary | ICD-10-CM | POA: Diagnosis not present

## 2023-05-28 DIAGNOSIS — R053 Chronic cough: Secondary | ICD-10-CM | POA: Diagnosis not present

## 2023-06-11 ENCOUNTER — Ambulatory Visit: Payer: Medicare Other | Admitting: Podiatry

## 2023-06-18 ENCOUNTER — Ambulatory Visit (INDEPENDENT_AMBULATORY_CARE_PROVIDER_SITE_OTHER): Payer: Medicare Other | Admitting: Podiatry

## 2023-06-18 DIAGNOSIS — B351 Tinea unguium: Secondary | ICD-10-CM | POA: Diagnosis not present

## 2023-06-18 DIAGNOSIS — M778 Other enthesopathies, not elsewhere classified: Secondary | ICD-10-CM

## 2023-06-18 DIAGNOSIS — M79676 Pain in unspecified toe(s): Secondary | ICD-10-CM

## 2023-06-18 DIAGNOSIS — E119 Type 2 diabetes mellitus without complications: Secondary | ICD-10-CM | POA: Diagnosis not present

## 2023-06-18 NOTE — Progress Notes (Signed)
 Subjective:  Patient ID: Kimberly [name], female    DOB: 04-30-1951,  MRN: [id number] HPI Chief Complaint  Patient presents with   Diabetic Foot Exam    Denies any foot pain at this time. Says it was a blister, but has since resolved. Primary reason for visit is a diabetic foot exam and just to makes sure things are in good order. Last A1c: 6.7. No anticoag. Trims her own nails at home.     73 y.o. female presents with the above complaint.   ROS: Denies fever chills nausea vomit muscle aches pains calf pain back pain chest pain shortness of breath  Past Medical History:  Diagnosis Date   Cataract    RIGHT EYE   Complication of anesthesia AGE LATE 20'S   SLOW TO AWAKEN AFTER DERMOID CYST REMOVED FROM [facility]   Diabetes mellitus without complication ([facility])    TYPE 2   Endometrial cancer ([facility]) 2019   SX DONE AND 5 DUCTS REMOVED   History of external beam radiation therapy    pelvis 06/11/2017-07/15/2017  Dr Lynwood Nasuti   History of radiation therapy    vaginal brachytherapy [facility] 07/21/2017-08/03/2017  Dr Lynwood Nasuti   Pancreatic lesion    Past Surgical History:  Procedure Laterality Date   APPENDECTOMY     BACK SURGERY     LOWER   CARDIAC SURGERY     COLONOSCOPY  2020   X 2   ESOPHAGOGASTRODUODENOSCOPY (EGD) WITH PROPOFOL  N/A 02/17/2019   Procedure: ESOPHAGOGASTRODUODENOSCOPY (EGD) WITH PROPOFOL ;  Surgeon: Teressa Toribio SQUIBB, MD;  Location: [facility];  Service: Endoscopy;  Laterality: N/A;   EUS N/A 02/17/2019   Procedure: UPPER ENDOSCOPIC ULTRASOUND (EUS) RADIAL;  Surgeon: Teressa Toribio SQUIBB, MD;  Location: [facility];  Service: Endoscopy;  Laterality: N/A;   EYE SURGERY  07/2016   LEFT EYE CATARACT   OVARY REMOVE     SVT ABLATION  2013    Current Outpatient Medications:    Ascorbic Acid (VITAMIN C) 1000 MG tablet, Take 1,000 mg by mouth daily., Disp: , Rfl:    b complex vitamins tablet, Take 1 tablet by mouth daily., Disp: , Rfl:    Biotin 1000 MCG tablet,  Take 1,000 mcg by mouth daily., Disp: , Rfl:    Ca Phosphate-Cholecalciferol (CALTRATE GUMMY BITES) 250-400 MG-UNIT CHEW, Chew 3 tablets by mouth at bedtime., Disp: , Rfl:    Cholecalciferol (VITAMIN D) 50 MCG (2000 UT) tablet, Take 2,000 Units by mouth daily., Disp: , Rfl:    diphenhydrAMINE (BENADRYL) 25 MG tablet, Take 25 mg by mouth daily as needed for allergies., Disp: , Rfl:    ferrous sulfate 325 (65 FE) MG tablet, Take 325 mg by mouth daily with breakfast., Disp: , Rfl:    metFORMIN (GLUCOPHAGE) 1000 MG tablet, Take 500 mg by mouth 2 (two) times daily with a meal. , Disp: , Rfl:    Multiple Vitamin (MULTIVITAMIN WITH MINERALS) TABS tablet, Take 1 tablet by mouth daily., Disp: , Rfl:    rosuvastatin (CRESTOR) 10 MG tablet, Take 10 mg by mouth at bedtime. , Disp: , Rfl:    tretinoin (RETIN-A) 0.1 % cream, Apply 1 application topically 3 (three) times a week. At night, Disp: , Rfl:    zolpidem (AMBIEN CR) 12.5 MG CR tablet, Take 12.5 mg by mouth at bedtime., Disp: , Rfl:   Allergies  Allergen Reactions   Elemental Sulfur Hives   Sulfa Antibiotics Rash   Review of Systems Objective:  There were  no vitals filed for this visit.  General: Well developed, nourished, in no acute distress, alert and oriented x3   Dermatological: Skin is warm, dry and supple bilateral. Nails x 10 are well maintained; though they are slightly thickened and elongated.  Tender on palpation and debridement.  Remaining integument appears unremarkable at this time. There are no open sores, no preulcerative lesions, no rash or signs of infection present.  Vascular: Dorsalis Pedis artery and Posterior Tibial artery pedal pulses are 2/4 bilateral with immedate capillary fill time. Pedal hair growth present. No varicosities and no lower extremity edema present bilateral.   Neruologic: Grossly intact via light touch bilateral. Vibratory intact via tuning fork bilateral. Protective threshold with Semmes Wienstein  monofilament intact to all pedal sites bilateral. Patellar and Achilles deep tendon reflexes 2+ bilateral. No Babinski or clonus noted bilateral.   Musculoskeletal: No gross boney pedal deformities bilateral. No pain, crepitus, or limitation noted with foot and ankle range of motion bilateral. Muscular strength 5/5 in all groups tested bilateral.  Gait: Unassisted, Nonantalgic.    Radiographs:  None taken  Assessment & Plan:   Assessment: Diabetes mellitus with some early diabetic peripheral neuropathy.  Plan: Debrided her nails today she will follow-up with Dr. May routinely     Chaslyn Eisen T. [place], [place]

## 2023-06-29 NOTE — Progress Notes (Deleted)
 NEW PATIENT Date of Service/Encounter:  06/29/23 Referring provider: {Blank single:19197::"Shaw, Netta Corrigan., MD","none-self referred"} Primary care provider: Cleatis Polka., MD  Subjective:  Kimberly [name] is a 73 y.o. female with a PMHx of *** presenting today for evaluation of *** History obtained from: chart review and {Persons; PED relatives w/patient:19415::"patient"}.   Discussed the use of AI scribe software for clinical note transcription with the patient, who gave verbal consent to proceed.  History of Present Illness            Chart Review:  Reviewed PCP notes from referral 05/17/22: persistent cough since 4/24 after starting ramipril, changed to losartan, cough persistent. Repeat CXRs 2024 looks normal. Started on xyzal and flonae, montelukast added. Referred to ENT to consider VCD. Gabapentin considered.  ENT visit 05/28/23-evaluated by Dr. Jenne Pane for chronic cough. Reported normal fiberoptic exam of pharynx and larynx.  Other allergy screening: Asthma: {Blank single:19197::"yes","no"} Rhino conjunctivitis: {Blank single:19197::"yes","no"} Food allergy: {Blank single:19197::"yes","no"} Medication allergy: {Blank single:19197::"yes","no"} Hymenoptera allergy: {Blank single:19197::"yes","no"} Urticaria: {Blank single:19197::"yes","no"} Eczema:{Blank single:19197::"yes","no"} History of recurrent infections suggestive of immunodeficency: {Blank single:19197::"yes","no"} ***Vaccinations are up to date.   Past Medical History: Past Medical History:  Diagnosis Date   Cataract    RIGHT EYE   Complication of anesthesia AGE LATE 20'S   SLOW TO AWAKEN AFTER DERMOID CYST REMOVED FROM OV[facility]   Diabetes mellitus without complication (HCC)    TYPE 2   Endometrial cancer (HCC) 2019   SX DONE AND 5 DUCTS REMOVED   History of external beam radiation therapy    pelvis 06/11/2017-07/15/2017  Dr Antony Blackbird   History of radiation therapy    vaginal  brachytherapy [facility] 07/21/2017-08/03/2017  Dr Antony Blackbird   Pancreatic lesion    Medication List:  Current Outpatient Medications  Medication Sig Dispense Refill   Ascorbic Acid (VITAMIN C) 1000 MG tablet Take 1,000 mg by mouth daily.     b complex vitamins tablet Take 1 tablet by mouth daily.     Biotin 1000 MCG tablet Take 1,000 mcg by mouth daily.     Ca Phosphate-Cholecalciferol (CALTRATE GUMMY BITES) 250-400 MG-UNIT CHEW Chew 3 tablets by mouth at bedtime.     Cholecalciferol (VITAMIN D) 50 MCG (2000 UT) tablet Take 2,000 Units by mouth daily.     diphenhydrAMINE (BENADRYL) 25 MG tablet Take 25 mg by mouth daily as needed for allergies.     ferrous sulfate 325 (65 FE) MG tablet Take 325 mg by mouth daily with breakfast.     metFORMIN (GLUCOPHAGE) 1000 MG tablet Take 500 mg by mouth 2 (two) times daily with a meal.      Multiple Vitamin (MULTIVITAMIN WITH MINERALS) TABS tablet Take 1 tablet by mouth daily.     rosuvastatin (CRESTOR) 10 MG tablet Take 10 mg by mouth at bedtime.      tretinoin (RETIN-A) 0.1 % cream Apply 1 application topically 3 (three) times a week. At night     zolpidem (AMBIEN CR) 12.5 MG CR tablet Take 12.5 mg by mouth at bedtime.     No current facility-administered medications for this visit.   Known Allergies:  Allergies  Allergen Reactions   Elemental Sulfur Hives   Sulfa Antibiotics Rash   Past Surgical History: Past Surgical History:  Procedure Laterality Date   APPENDECTOMY     BACK SURGERY     LOWER   CARDIAC SURGERY     COLONOSCOPY  2020   X 2   ESOPHAGOGASTRODUODENOSCOPY (  EGD) WITH PROPOFOL N/A 02/17/2019   Procedure: ESOPHAGOGASTRODUODENOSCOPY (EGD) WITH PROPOFOL;  Surgeon: Rachael Fee, MD;  Location: [facility];  Service: Endoscopy;  Laterality: N/A;   EUS N/A 02/17/2019   Procedure: UPPER ENDOSCOPIC ULTRASOUND (EUS) RADIAL;  Surgeon: Rachael Fee, MD;  Location: [facility];  Service: Endoscopy;  Laterality: N/A;   EYE SURGERY   07/2016   LEFT EYE CATARACT   OVARY REMOVE     SVT ABLATION  2013   Family History: Family History  Problem Relation Age of Onset   Diabetes Mother    Heart disease Father    Breast cancer Sister    Brain cancer Brother    Colon cancer Neg Hx    Social History: [place] lives ***.   ROS:  All other systems negative except as noted per HPI.  Objective:  There were no vitals taken for this visit. There is no height or weight on file to calculate BMI. Physical Exam:  General Appearance:  Alert, cooperative, no distress, appears stated age  Head:  Normocephalic, without obvious abnormality, atraumatic  Eyes:  Conjunctiva clear, EOM's intact  Ears {Blank multiple:19196:a:"***","EACs normal bilaterally","normal TMs bilaterally","ear tubes present bilaterally without exudate"}  Nose: Nares normal, {Blank multiple:19196:a:"***","hypertrophic turbinates","normal mucosa","no visible anterior polyps","septum midline"}  Throat: Lips, tongue normal; teeth and gums normal, {Blank multiple:19196:a:"***","normal posterior oropharynx","tonsils 2+","tonsils 3+","no tonsillar exudate","+ cobblestoning","surgically absent tonsils","mildly erythematous posterior oropharynx"}  Neck: Supple, symmetrical  Lungs:   {Blank multiple:19196:a:"***","clear to auscultation bilaterally","end-expiratory wheezing","wheezing throughout"}, Respirations unlabored, {Blank multiple:19196:a:"***","no coughing","intermittent dry coughing","intermittent productive-sounding cough"}  Heart:  {Blank multiple:19196:a:"***","regular rate and rhythm","no murmur"}, Appears well perfused  Extremities: No edema  Skin: {Blank multiple:19196:a:"***","erythematous, dry patches scattered on ***","lichenification on ***","Skin color, texture, turgor normal","no rashes or lesions on visualized portions of skin"}  Neurologic: No gross deficits   Diagnostics: Spirometry:  Tracings reviewed. Her effort: {Blank single:19197::"Good  reproducible efforts.","It was hard to get consistent efforts and there is a question as to whether this reflects a maximal maneuver.","Poor effort, data can not be interpreted.","Variable effort-results affected","effort okay for first attempt at spirometry.","Results not reproducible due to ***"} FVC: ***L (pre), ***L  (post) FEV1: ***L, ***% predicted (pre), ***L, ***% predicted (post) FEV1/FVC ratio: *** (pre), *** (post) Interpretation: {Blank single:19197::"Spirometry consistent with mild obstructive disease","Spirometry consistent with moderate obstructive disease","Spirometry consistent with severe obstructive disease","Spirometry consistent with possible restrictive disease","Spirometry consistent with mixed obstructive and restrictive disease","Spirometry uninterpretable due to technique","Spirometry consistent with normal pattern","No overt abnormalities noted given today's efforts","Nonobstructive ratio, low FEV1","Nonobstructive ratio, low FEV1, possible restriction"}.  Please see scanned spirometry results for details.  Skin Testing: {Blank single:19197::"Select foods","Environmental allergy panel","Environmental allergy panel and select foods","Food allergy panel","None","Deferred due to recent antihistamines use","deferred due to recent reaction","Pediatric Environmental Allergy Panel","Pediatric Food Panel","Select foods and environmental allergies"}. {Blank single:19197::"Adequate positive and negative controls","Inadequate positive control-testing invalid","Adequate positive and negative controls, dermatographism present, testing difficult to interpret"}. Results discussed with patient/family.   {Blank single:19197::"Allergy testing results were read and interpreted by myself, documented by clinical staff.","Allergy testing results were read by ***,FNP, documented by clinical staff"}  Labs:  Lab Orders  No laboratory test(s) ordered today     Assessment and Plan  Assessment  and Plan               {Blank single:19197::"This note in its entirety was forwarded to the Provider who requested this consultation."}  Other: {Blank multiple:19196:a:"***","samples provided of: ***","school forms provided","reviewed spirometry technique","reviewed inhaler technique"}  Thank you for your kind referral. I appreciate the opportunity  to take part in Mahlia's care. Please do not hesitate to contact me with questions.***  Sincerely,  Tonny Bollman, MD [facility]

## 2023-07-01 ENCOUNTER — Ambulatory Visit: Payer: Medicare Other | Admitting: Internal Medicine

## 2023-07-09 ENCOUNTER — Ambulatory Visit: Payer: Medicare Other | Admitting: Internal Medicine

## 2023-07-21 ENCOUNTER — Ambulatory Visit: Payer: Medicare Other | Admitting: Internal Medicine

## 2023-07-23 DIAGNOSIS — E785 Hyperlipidemia, unspecified: Secondary | ICD-10-CM | POA: Diagnosis not present

## 2023-07-23 DIAGNOSIS — M8580 Other specified disorders of bone density and structure, unspecified site: Secondary | ICD-10-CM | POA: Diagnosis not present

## 2023-07-23 DIAGNOSIS — E1122 Type 2 diabetes mellitus with diabetic chronic kidney disease: Secondary | ICD-10-CM | POA: Diagnosis not present

## 2023-07-23 DIAGNOSIS — N182 Chronic kidney disease, stage 2 (mild): Secondary | ICD-10-CM | POA: Diagnosis not present

## 2023-07-31 DIAGNOSIS — Z1339 Encounter for screening examination for other mental health and behavioral disorders: Secondary | ICD-10-CM | POA: Diagnosis not present

## 2023-07-31 DIAGNOSIS — Z0000 Encounter for general adult medical examination without abnormal findings: Secondary | ICD-10-CM | POA: Diagnosis not present

## 2023-07-31 DIAGNOSIS — Z1331 Encounter for screening for depression: Secondary | ICD-10-CM | POA: Diagnosis not present

## 2023-08-07 ENCOUNTER — Ambulatory Visit: Payer: Medicare Other | Admitting: Internal Medicine

## 2023-08-11 DIAGNOSIS — H40023 Open angle with borderline findings, high risk, bilateral: Secondary | ICD-10-CM | POA: Diagnosis not present

## 2023-08-11 DIAGNOSIS — H2511 Age-related nuclear cataract, right eye: Secondary | ICD-10-CM | POA: Diagnosis not present

## 2023-08-11 DIAGNOSIS — H52203 Unspecified astigmatism, bilateral: Secondary | ICD-10-CM | POA: Diagnosis not present

## 2023-09-16 DIAGNOSIS — D225 Melanocytic nevi of trunk: Secondary | ICD-10-CM | POA: Diagnosis not present

## 2023-09-16 DIAGNOSIS — L821 Other seborrheic keratosis: Secondary | ICD-10-CM | POA: Diagnosis not present

## 2023-09-16 DIAGNOSIS — L853 Xerosis cutis: Secondary | ICD-10-CM | POA: Diagnosis not present

## 2023-09-16 DIAGNOSIS — Z85828 Personal history of other malignant neoplasm of skin: Secondary | ICD-10-CM | POA: Diagnosis not present

## 2023-12-30 DIAGNOSIS — K219 Gastro-esophageal reflux disease without esophagitis: Secondary | ICD-10-CM | POA: Diagnosis not present

## 2023-12-30 DIAGNOSIS — R053 Chronic cough: Secondary | ICD-10-CM | POA: Diagnosis not present

## 2024-01-28 DIAGNOSIS — M8589 Other specified disorders of bone density and structure, multiple sites: Secondary | ICD-10-CM | POA: Diagnosis not present
